# Patient Record
Sex: Female | Born: 1945
Health system: Southern US, Community
[De-identification: ages and names within clinical notes are randomized; demographics above are authoritative.]

## PROBLEM LIST (undated history)

## (undated) DIAGNOSIS — M1990 Unspecified osteoarthritis, unspecified site: Secondary | ICD-10-CM

## (undated) DIAGNOSIS — K219 Gastro-esophageal reflux disease without esophagitis: Secondary | ICD-10-CM

## (undated) DIAGNOSIS — K8020 Calculus of gallbladder without cholecystitis without obstruction: Secondary | ICD-10-CM

## (undated) DIAGNOSIS — Z5189 Encounter for other specified aftercare: Secondary | ICD-10-CM

## (undated) DIAGNOSIS — D649 Anemia, unspecified: Secondary | ICD-10-CM

## (undated) DIAGNOSIS — E785 Hyperlipidemia, unspecified: Secondary | ICD-10-CM

## (undated) DIAGNOSIS — I10 Essential (primary) hypertension: Secondary | ICD-10-CM

## (undated) DIAGNOSIS — H9191 Unspecified hearing loss, right ear: Secondary | ICD-10-CM

## (undated) DIAGNOSIS — IMO0001 Reserved for inherently not codable concepts without codable children: Secondary | ICD-10-CM

## (undated) DIAGNOSIS — F419 Anxiety disorder, unspecified: Secondary | ICD-10-CM

## (undated) HISTORY — PX: TONSILLECTOMY: SUR1361

## (undated) HISTORY — PX: OVARY SURGERY: SHX727

## (undated) HISTORY — DX: Hyperlipidemia, unspecified: E78.5

## (undated) HISTORY — PX: CHOLECYSTECTOMY: SHX55

## (undated) HISTORY — PX: APPENDECTOMY: SHX54

## (undated) HISTORY — PX: BACK SURGERY: SHX140

## (undated) HISTORY — DX: Anxiety disorder, unspecified: F41.9

## (undated) HISTORY — PX: BREAST EXCISIONAL BIOPSY: SUR124

---

## 1988-05-26 HISTORY — PX: BREAST CYST ASPIRATION: SHX578

## 2001-01-11 ENCOUNTER — Encounter: Payer: Self-pay | Admitting: Neurosurgery

## 2001-01-13 ENCOUNTER — Encounter: Payer: Self-pay | Admitting: Neurosurgery

## 2001-01-14 ENCOUNTER — Inpatient Hospital Stay (HOSPITAL_COMMUNITY): Admission: RE | Admit: 2001-01-14 | Discharge: 2001-01-15 | Payer: Self-pay | Admitting: Neurosurgery

## 2003-11-09 ENCOUNTER — Inpatient Hospital Stay (HOSPITAL_COMMUNITY): Admission: RE | Admit: 2003-11-09 | Discharge: 2003-11-10 | Payer: Self-pay | Admitting: Neurosurgery

## 2003-12-19 ENCOUNTER — Encounter: Admission: RE | Admit: 2003-12-19 | Discharge: 2003-12-19 | Payer: Self-pay | Admitting: Neurosurgery

## 2004-01-31 ENCOUNTER — Encounter: Admission: RE | Admit: 2004-01-31 | Discharge: 2004-01-31 | Payer: Self-pay | Admitting: Neurosurgery

## 2004-09-09 ENCOUNTER — Ambulatory Visit: Payer: Self-pay | Admitting: Family Medicine

## 2006-01-14 ENCOUNTER — Ambulatory Visit: Payer: Self-pay | Admitting: Family Medicine

## 2007-02-10 ENCOUNTER — Ambulatory Visit: Payer: Self-pay | Admitting: Family Medicine

## 2007-07-09 ENCOUNTER — Ambulatory Visit: Payer: Self-pay | Admitting: Gastroenterology

## 2008-02-14 ENCOUNTER — Ambulatory Visit: Payer: Self-pay | Admitting: Family Medicine

## 2009-02-19 ENCOUNTER — Ambulatory Visit: Payer: Self-pay | Admitting: Family Medicine

## 2010-02-28 ENCOUNTER — Ambulatory Visit: Payer: Self-pay | Admitting: Family Medicine

## 2011-03-03 ENCOUNTER — Ambulatory Visit: Payer: Self-pay | Admitting: Internal Medicine

## 2011-05-05 ENCOUNTER — Other Ambulatory Visit: Payer: Self-pay | Admitting: Neurosurgery

## 2011-05-05 DIAGNOSIS — M545 Low back pain: Secondary | ICD-10-CM

## 2011-05-06 ENCOUNTER — Ambulatory Visit
Admission: RE | Admit: 2011-05-06 | Discharge: 2011-05-06 | Disposition: A | Discharge status: Home / Self Care | Payer: BC Managed Care – PPO | Source: Ambulatory Visit | Attending: Neurosurgery | Admitting: Neurosurgery

## 2011-05-06 DIAGNOSIS — M545 Low back pain: Secondary | ICD-10-CM

## 2011-05-08 ENCOUNTER — Other Ambulatory Visit: Payer: Self-pay

## 2011-05-15 ENCOUNTER — Other Ambulatory Visit: Payer: Self-pay | Admitting: Neurosurgery

## 2011-05-15 DIAGNOSIS — M79605 Pain in left leg: Secondary | ICD-10-CM

## 2011-05-15 DIAGNOSIS — M545 Low back pain: Secondary | ICD-10-CM

## 2011-05-16 ENCOUNTER — Ambulatory Visit
Admission: RE | Admit: 2011-05-16 | Discharge: 2011-05-16 | Disposition: A | Discharge status: Home / Self Care | Payer: BC Managed Care – PPO | Source: Ambulatory Visit | Attending: Neurosurgery | Admitting: Neurosurgery

## 2011-05-16 DIAGNOSIS — M79605 Pain in left leg: Secondary | ICD-10-CM

## 2011-05-16 DIAGNOSIS — M545 Low back pain: Secondary | ICD-10-CM

## 2011-05-16 MED ORDER — IOHEXOL 180 MG/ML  SOLN
1.0000 mL | Freq: Once | INTRAMUSCULAR | Status: AC | PRN
  Administered 2011-05-16: 1 mL via EPIDURAL

## 2011-05-16 MED ORDER — METHYLPREDNISOLONE ACETATE 40 MG/ML INJ SUSP (RADIOLOG
120.0000 mg | Freq: Once | INTRAMUSCULAR | Status: AC
  Administered 2011-05-16: 120 mg via EPIDURAL

## 2011-05-23 ENCOUNTER — Encounter: Payer: Self-pay | Admitting: Neurosurgery

## 2011-05-27 ENCOUNTER — Encounter: Payer: Self-pay | Admitting: Neurosurgery

## 2011-06-20 ENCOUNTER — Encounter (HOSPITAL_COMMUNITY): Payer: Self-pay | Admitting: Pharmacy Technician

## 2011-06-23 ENCOUNTER — Other Ambulatory Visit (HOSPITAL_COMMUNITY): Payer: Self-pay | Admitting: *Deleted

## 2011-06-23 ENCOUNTER — Encounter (HOSPITAL_COMMUNITY): Payer: Self-pay

## 2011-06-24 ENCOUNTER — Encounter (HOSPITAL_COMMUNITY): Payer: Self-pay

## 2011-06-24 ENCOUNTER — Encounter (HOSPITAL_COMMUNITY): Payer: Self-pay | Admitting: Anesthesiology

## 2011-06-24 ENCOUNTER — Inpatient Hospital Stay (HOSPITAL_COMMUNITY)
Admission: RE | Admit: 2011-06-24 | Discharge: 2011-06-25 | DRG: 756 | Disposition: A | Discharge status: Home / Self Care | Payer: BC Managed Care – PPO | Source: Ambulatory Visit | Attending: Neurosurgery | Admitting: Neurosurgery

## 2011-06-24 ENCOUNTER — Inpatient Hospital Stay (HOSPITAL_COMMUNITY): Payer: BC Managed Care – PPO

## 2011-06-24 ENCOUNTER — Ambulatory Visit (HOSPITAL_COMMUNITY): Payer: BC Managed Care – PPO

## 2011-06-24 ENCOUNTER — Other Ambulatory Visit: Payer: Self-pay

## 2011-06-24 ENCOUNTER — Encounter (HOSPITAL_COMMUNITY)
Admission: RE | Disposition: A | Discharge status: Home / Self Care | Payer: Self-pay | Source: Ambulatory Visit | Attending: Neurosurgery

## 2011-06-24 ENCOUNTER — Ambulatory Visit (HOSPITAL_COMMUNITY): Payer: BC Managed Care – PPO | Admitting: Anesthesiology

## 2011-06-24 DIAGNOSIS — Z01818 Encounter for other preprocedural examination: Secondary | ICD-10-CM

## 2011-06-24 DIAGNOSIS — Z01812 Encounter for preprocedural laboratory examination: Secondary | ICD-10-CM

## 2011-06-24 DIAGNOSIS — M48061 Spinal stenosis, lumbar region without neurogenic claudication: Secondary | ICD-10-CM

## 2011-06-24 HISTORY — DX: Encounter for other specified aftercare: Z51.89

## 2011-06-24 HISTORY — DX: Gastro-esophageal reflux disease without esophagitis: K21.9

## 2011-06-24 HISTORY — DX: Essential (primary) hypertension: I10

## 2011-06-24 HISTORY — DX: Anemia, unspecified: D64.9

## 2011-06-24 HISTORY — DX: Calculus of gallbladder without cholecystitis without obstruction: K80.20

## 2011-06-24 HISTORY — PX: ANTERIOR LAT LUMBAR FUSION: SHX1168

## 2011-06-24 HISTORY — DX: Reserved for inherently not codable concepts without codable children: IMO0001

## 2011-06-24 LAB — BASIC METABOLIC PANEL
BUN: 27 mg/dL — ABNORMAL HIGH (ref 6–23)
CO2: 29 mEq/L (ref 19–32)
Chloride: 101 mEq/L (ref 96–112)
Creatinine, Ser: 0.86 mg/dL (ref 0.50–1.10)
GFR calc Af Amer: 82 mL/min — ABNORMAL LOW (ref >90)
Glucose, Bld: 97 mg/dL (ref 70–99)
Potassium: 4.3 mEq/L (ref 3.5–5.1)

## 2011-06-24 LAB — CBC
HCT: 40.4 % (ref 36.0–46.0)
Hemoglobin: 13.1 g/dL (ref 12.0–15.0)
MCV: 86.7 fL (ref 78.0–100.0)
RDW: 13.5 % (ref 11.5–15.5)
WBC: 6.3 10*3/uL (ref 4.0–10.5)

## 2011-06-24 SURGERY — ANTERIOR LATERAL LUMBAR FUSION 1 LEVEL
Anesthesia: General | Laterality: Right | Wound class: Clean

## 2011-06-24 MED ORDER — BUPIVACAINE HCL 0.5 % IJ SOLN
INTRAMUSCULAR | Status: DC | PRN
  Administered 2011-06-24: 20 mL

## 2011-06-24 MED ORDER — METHOCARBAMOL 500 MG PO TABS
500.0000 mg | ORAL_TABLET | Freq: Four times a day (QID) | ORAL | Status: DC | PRN
  Administered 2011-06-24 – 2011-06-25 (×2): 500 mg via ORAL
  Filled 2011-06-24 (×3): qty 1

## 2011-06-24 MED ORDER — ONDANSETRON HCL 4 MG/2ML IJ SOLN
INTRAMUSCULAR | Status: DC | PRN
  Administered 2011-06-24: 4 mg via INTRAVENOUS

## 2011-06-24 MED ORDER — BUPIVACAINE HCL 0.5 % IJ SOLN
INTRAMUSCULAR | Status: DC | PRN
  Administered 2011-06-24: 16 mL

## 2011-06-24 MED ORDER — SODIUM CHLORIDE 0.9 % IV SOLN
INTRAVENOUS | Status: AC
  Filled 2011-06-24: qty 500

## 2011-06-24 MED ORDER — PROPOFOL 10 MG/ML IV EMUL
INTRAVENOUS | Status: DC | PRN
  Administered 2011-06-24: 150 mg via INTRAVENOUS

## 2011-06-24 MED ORDER — SUCCINYLCHOLINE CHLORIDE 20 MG/ML IJ SOLN
INTRAMUSCULAR | Status: DC | PRN
  Administered 2011-06-24: 100 mg via INTRAVENOUS

## 2011-06-24 MED ORDER — LACTATED RINGERS IV SOLN
INTRAVENOUS | Status: DC | PRN
  Administered 2011-06-24 (×2): via INTRAVENOUS

## 2011-06-24 MED ORDER — TOBRAMYCIN SULFATE 80 MG/2ML IJ SOLN
80.0000 mg | Freq: Once | INTRAVENOUS | Status: AC
  Administered 2011-06-24: 80 mg via INTRAVENOUS
  Filled 2011-06-24: qty 2

## 2011-06-24 MED ORDER — 0.9 % SODIUM CHLORIDE (POUR BTL) OPTIME
TOPICAL | Status: DC | PRN
  Administered 2011-06-24: 1000 mL

## 2011-06-24 MED ORDER — FENTANYL CITRATE 0.05 MG/ML IJ SOLN
INTRAMUSCULAR | Status: DC | PRN
  Administered 2011-06-24 (×2): 50 ug via INTRAVENOUS
  Administered 2011-06-24: 150 ug via INTRAVENOUS
  Administered 2011-06-24: 100 ug via INTRAVENOUS

## 2011-06-24 MED ORDER — ZOLPIDEM TARTRATE 5 MG PO TABS: 10.0000 mg | ORAL_TABLET | Freq: Every evening | ORAL | Status: DC | PRN

## 2011-06-24 MED ORDER — LACTATED RINGERS IV SOLN
INTRAVENOUS | Status: DC | PRN
  Administered 2011-06-24: 08:00:00 via INTRAVENOUS

## 2011-06-24 MED ORDER — BUSPIRONE HCL 5 MG PO TABS
5.0000 mg | ORAL_TABLET | Freq: Three times a day (TID) | ORAL | Status: DC | PRN
  Filled 2011-06-24: qty 1

## 2011-06-24 MED ORDER — ACETAMINOPHEN 325 MG PO TABS: 650.0000 mg | ORAL_TABLET | ORAL | Status: DC | PRN

## 2011-06-24 MED ORDER — SODIUM CHLORIDE 0.9 % IR SOLN
Status: DC | PRN
  Administered 2011-06-24: 09:00:00

## 2011-06-24 MED ORDER — THROMBIN 5000 UNITS EX SOLR
CUTANEOUS | Status: DC | PRN
  Administered 2011-06-24: 5000 [IU] via TOPICAL

## 2011-06-24 MED ORDER — MIDAZOLAM HCL 5 MG/5ML IJ SOLN
INTRAMUSCULAR | Status: DC | PRN
  Administered 2011-06-24: 2 mg via INTRAVENOUS

## 2011-06-24 MED ORDER — SODIUM CHLORIDE 0.9 % IJ SOLN: 3.0000 mL | Freq: Two times a day (BID) | INTRAMUSCULAR | Status: DC

## 2011-06-24 MED ORDER — HYDROMORPHONE HCL PF 1 MG/ML IJ SOLN
1.0000 mg | INTRAMUSCULAR | Status: DC | PRN
  Administered 2011-06-24 (×2): 1 mg via INTRAMUSCULAR
  Filled 2011-06-24 (×2): qty 1

## 2011-06-24 MED ORDER — METHOCARBAMOL 100 MG/ML IJ SOLN
500.0000 mg | Freq: Four times a day (QID) | INTRAVENOUS | Status: DC | PRN
  Administered 2011-06-24: 500 mg via INTRAVENOUS
  Filled 2011-06-24: qty 5

## 2011-06-24 MED ORDER — PHENOL 1.4 % MT LIQD: 1.0000 | OROMUCOSAL | Status: DC | PRN

## 2011-06-24 MED ORDER — MENTHOL 3 MG MT LOZG: 1.0000 | LOZENGE | OROMUCOSAL | Status: DC | PRN

## 2011-06-24 MED ORDER — ONDANSETRON HCL 4 MG/2ML IJ SOLN: 4.0000 mg | Freq: Once | INTRAMUSCULAR | Status: DC | PRN

## 2011-06-24 MED ORDER — BACITRACIN 50000 UNITS IM SOLR
INTRAMUSCULAR | Status: AC
  Filled 2011-06-24: qty 1

## 2011-06-24 MED ORDER — SODIUM CHLORIDE 0.9 % IJ SOLN: 3.0000 mL | INTRAMUSCULAR | Status: DC | PRN

## 2011-06-24 MED ORDER — LISINOPRIL 5 MG PO TABS
5.0000 mg | ORAL_TABLET | Freq: Every day | ORAL | Status: DC
  Filled 2011-06-24 (×2): qty 1

## 2011-06-24 MED ORDER — ACETAMINOPHEN 650 MG RE SUPP: 650.0000 mg | RECTAL | Status: DC | PRN

## 2011-06-24 MED ORDER — ALENDRONATE SODIUM 70 MG PO TABS: 70.0000 mg | ORAL_TABLET | ORAL | Status: DC

## 2011-06-24 MED ORDER — HYDROMORPHONE HCL PF 1 MG/ML IJ SOLN
0.5000 mg | INTRAMUSCULAR | Status: DC | PRN
  Administered 2011-06-24: 0.5 mg via INTRAVENOUS

## 2011-06-24 MED ORDER — MUPIROCIN 2 % EX OINT
TOPICAL_OINTMENT | CUTANEOUS | Status: AC
  Filled 2011-06-24: qty 22

## 2011-06-24 MED ORDER — HYDROCHLOROTHIAZIDE 25 MG PO TABS
25.0000 mg | ORAL_TABLET | Freq: Every day | ORAL | Status: DC
Start: 2011-06-24 — End: 2011-06-25
  Filled 2011-06-24 (×2): qty 1

## 2011-06-24 MED ORDER — MORPHINE SULFATE 2 MG/ML IJ SOLN: 0.0500 mg/kg | INTRAMUSCULAR | Status: DC | PRN

## 2011-06-24 MED ORDER — HEMOSTATIC AGENTS (NO CHARGE) OPTIME
TOPICAL | Status: DC | PRN
  Administered 2011-06-24: 1 via TOPICAL

## 2011-06-24 MED ORDER — DEXAMETHASONE SODIUM PHOSPHATE 10 MG/ML IJ SOLN
10.0000 mg | Freq: Once | INTRAMUSCULAR | Status: AC
  Administered 2011-06-24: 10 mg via INTRAVENOUS
  Filled 2011-06-24: qty 1

## 2011-06-24 MED ORDER — DIPHENHYDRAMINE HCL 50 MG/ML IJ SOLN
50.0000 mg | Freq: Once | INTRAMUSCULAR | Status: DC
  Filled 2011-06-24: qty 1

## 2011-06-24 MED ORDER — ONDANSETRON HCL 4 MG/2ML IJ SOLN: 4.0000 mg | INTRAMUSCULAR | Status: DC | PRN

## 2011-06-24 MED ORDER — KCL IN DEXTROSE-NACL 20-5-0.45 MEQ/L-%-% IV SOLN
80.0000 mL/h | INTRAVENOUS | Status: DC
  Filled 2011-06-24 (×3): qty 1000

## 2011-06-24 MED ORDER — SIMVASTATIN 5 MG PO TABS
5.0000 mg | ORAL_TABLET | Freq: Every day | ORAL | Status: DC
  Administered 2011-06-24: 5 mg via ORAL
  Filled 2011-06-24 (×2): qty 1

## 2011-06-24 MED ORDER — MEPERIDINE HCL 25 MG/ML IJ SOLN: 6.2500 mg | INTRAMUSCULAR | Status: DC | PRN

## 2011-06-24 MED ORDER — HYDROCODONE-ACETAMINOPHEN 5-325 MG PO TABS
1.0000 | ORAL_TABLET | ORAL | Status: DC | PRN
  Administered 2011-06-24 – 2011-06-25 (×4): 2 via ORAL
  Filled 2011-06-24 (×4): qty 2

## 2011-06-24 MED ORDER — LIDOCAINE HCL 4 % MT SOLN
OROMUCOSAL | Status: DC | PRN
  Administered 2011-06-24: 4 mL via TOPICAL

## 2011-06-24 MED ORDER — HYDROMORPHONE HCL PF 1 MG/ML IJ SOLN
INTRAMUSCULAR | Status: AC
  Filled 2011-06-24: qty 1

## 2011-06-24 MED ORDER — BACITRACIN ZINC 500 UNIT/GM EX OINT
TOPICAL_OINTMENT | CUTANEOUS | Status: DC | PRN
  Administered 2011-06-24: 1 via TOPICAL

## 2011-06-24 MED ORDER — HYDROMORPHONE HCL PF 1 MG/ML IJ SOLN
0.2500 mg | INTRAMUSCULAR | Status: DC | PRN
  Administered 2011-06-24 (×5): 0.5 mg via INTRAVENOUS

## 2011-06-24 MED ORDER — ARTIFICIAL TEARS OP OINT
TOPICAL_OINTMENT | OPHTHALMIC | Status: DC | PRN
  Administered 2011-06-24: 1 via OPHTHALMIC

## 2011-06-24 MED ORDER — VANCOMYCIN HCL 500 MG IV SOLR
500.0000 mg | Freq: Once | INTRAVENOUS | Status: AC
  Administered 2011-06-24: 500 mg via INTRAVENOUS
  Filled 2011-06-24: qty 500

## 2011-06-24 MED ORDER — PROPOFOL 10 MG/ML IV EMUL
INTRAVENOUS | Status: DC | PRN
  Administered 2011-06-24: 75 ug/kg/min via INTRAVENOUS

## 2011-06-24 SURGICAL SUPPLY — 73 items
ADH SKN CLS APL DERMABOND .7 (GAUZE/BANDAGES/DRESSINGS) ×2
APL SKNCLS STERI-STRIP NONHPOA (GAUZE/BANDAGES/DRESSINGS) ×2
BAG DECANTER FOR FLEXI CONT (MISCELLANEOUS) ×3 IMPLANT
BENZOIN TINCTURE PRP APPL 2/3 (GAUZE/BANDAGES/DRESSINGS) ×3 IMPLANT
BLADE SURG ROTATE 9660 (MISCELLANEOUS) IMPLANT
BONE MATRIX OSTEOCEL PLUS 1CC (Bone Implant) ×2 IMPLANT
BRUSH SCRUB EZ PLAIN DRY (MISCELLANEOUS) ×3 IMPLANT
CLOTH BEACON ORANGE TIMEOUT ST (SAFETY) ×3 IMPLANT
CONT SPEC 4OZ CLIKSEAL STRL BL (MISCELLANEOUS) ×1 IMPLANT
COVER BACK TABLE 24X17X13 BIG (DRAPES) ×1 IMPLANT
COVER TABLE BACK 60X90 (DRAPES) ×2 IMPLANT
DERMABOND ADVANCED (GAUZE/BANDAGES/DRESSINGS) ×1
DERMABOND ADVANCED .7 DNX12 (GAUZE/BANDAGES/DRESSINGS) ×2 IMPLANT
DILATOR NON-RADIOLUCENT CANN (MISCELLANEOUS) ×1 IMPLANT
DRAPE C-ARM 42X72 X-RAY (DRAPES) ×3 IMPLANT
DRAPE C-ARMOR (DRAPES) ×3 IMPLANT
DRAPE LAPAROTOMY 100X72X124 (DRAPES) ×3 IMPLANT
DRAPE SURG 17X23 STRL (DRAPES) ×6 IMPLANT
DRESSING TELFA 8X3 (GAUZE/BANDAGES/DRESSINGS) ×4 IMPLANT
DURAPREP 26ML APPLICATOR (WOUND CARE) ×3 IMPLANT
ELECT BLADE 4.0 EZ CLEAN MEGAD (MISCELLANEOUS) ×3
ELECT REM PT RETURN 9FT ADLT (ELECTROSURGICAL) ×3
ELECTRODE BLDE 4.0 EZ CLN MEGD (MISCELLANEOUS) ×2 IMPLANT
ELECTRODE REM PT RTRN 9FT ADLT (ELECTROSURGICAL) ×2 IMPLANT
EVACUATOR 1/8 PVC DRAIN (DRAIN) IMPLANT
GAUZE SPONGE 4X4 16PLY XRAY LF (GAUZE/BANDAGES/DRESSINGS) ×1 IMPLANT
GLOVE BIO SURGEON STRL SZ8 (GLOVE) IMPLANT
GLOVE BIOGEL PI IND STRL 7.0 (GLOVE) IMPLANT
GLOVE BIOGEL PI INDICATOR 7.0 (GLOVE) ×1
GLOVE ECLIPSE 7.5 STRL STRAW (GLOVE) ×3 IMPLANT
GLOVE EXAM NITRILE LRG STRL (GLOVE) IMPLANT
GLOVE EXAM NITRILE MD LF STRL (GLOVE) ×2 IMPLANT
GLOVE EXAM NITRILE XL STR (GLOVE) IMPLANT
GLOVE EXAM NITRILE XS STR PU (GLOVE) IMPLANT
GLOVE SURG SS PI 6.5 STRL IVOR (GLOVE) ×2 IMPLANT
GOWN BRE IMP SLV AUR LG STRL (GOWN DISPOSABLE) ×3 IMPLANT
GOWN BRE IMP SLV AUR XL STRL (GOWN DISPOSABLE) ×1 IMPLANT
GOWN STRL REIN 2XL LVL4 (GOWN DISPOSABLE) ×3 IMPLANT
IMPL COROENT XL 8X8X45 (Intraocular Lens) IMPLANT
IMPLANT COROENT XL 8X8X45 (Intraocular Lens) ×3 IMPLANT
K-WIRE NITHNOL TROCAR TIP (WIRE) ×2 IMPLANT
KIT BASIN OR (CUSTOM PROCEDURE TRAY) ×3 IMPLANT
KIT DILATOR XLIF 5 (KITS) IMPLANT
KIT MAXCESS (KITS) ×1 IMPLANT
KIT NDL NVM5 EMG ELECT (KITS) IMPLANT
KIT NEEDLE NVM5 EMG ELECT (KITS) ×2 IMPLANT
KIT NEEDLE NVM5 EMG ELECTRODE (KITS) ×1
KIT ROOM TURNOVER OR (KITS) ×3 IMPLANT
KIT XLIF (KITS) ×1
NEEDLE HYPO 22GX1.5 SAFETY (NEEDLE) ×3 IMPLANT
NEEDLE TARGETING (NEEDLE) ×2 IMPLANT
NS IRRIG 1000ML POUR BTL (IV SOLUTION) ×3 IMPLANT
PACK LAMINECTOMY NEURO (CUSTOM PROCEDURE TRAY) ×3 IMPLANT
PUTTY BONE GRAFT KIT 2.5ML (Bone Implant) ×1 IMPLANT
ROD PREBENT 45MM (Rod) ×1 IMPLANT
SCREW POLYAXIA MIS 6.5X40MM (Screw) ×2 IMPLANT
SPONGE GAUZE 4X4 12PLY (GAUZE/BANDAGES/DRESSINGS) ×3 IMPLANT
SPONGE LAP 4X18 X RAY DECT (DISPOSABLE) IMPLANT
SPONGE SURGIFOAM ABS GEL SZ50 (HEMOSTASIS) ×1 IMPLANT
STAPLER SKIN PROX WIDE 3.9 (STAPLE) ×3 IMPLANT
SUT VIC AB 2-0 OS6 18 (SUTURE) ×19 IMPLANT
SUT VIC AB 3-0 CP2 18 (SUTURE) ×9 IMPLANT
SYR 20ML ECCENTRIC (SYRINGE) ×3 IMPLANT
TAPE CLOTH 3X10 TAN LF (GAUZE/BANDAGES/DRESSINGS) ×6 IMPLANT
TAPE CLOTH SURG 4X10 WHT LF (GAUZE/BANDAGES/DRESSINGS) ×1 IMPLANT
TISSUE DILATOR C, RADIOLUCENT ×1 IMPLANT
TOP CLSR SEQUOIA (Orthopedic Implant) ×2 IMPLANT
TOWEL OR 17X24 6PK STRL BLUE (TOWEL DISPOSABLE) ×3 IMPLANT
TOWEL OR 17X26 10 PK STRL BLUE (TOWEL DISPOSABLE) ×3 IMPLANT
TRAP SPECIMEN MUCOUS 40CC (MISCELLANEOUS) IMPLANT
TRAY FOLEY BAG SILVER LF 14FR (CATHETERS) ×1 IMPLANT
TRAY FOLEY CATH 14FRSI W/METER (CATHETERS) ×2 IMPLANT
WATER STERILE IRR 1000ML POUR (IV SOLUTION) ×3 IMPLANT

## 2011-06-24 NOTE — OR Nursing (Signed)
Neuro Monitoring provided by [employer] 

## 2011-06-24 NOTE — Addendum Note (Signed)
Addendum  created 06/24/11 1134 by Kerby Nora, MD   Modules edited:Orders

## 2011-06-24 NOTE — H&P (Signed)
Brittany Alvarez is an 66 y.o. female.   Chief Complaint: Back and leg pain HPI: The patient is 66 year old female who had a fusion at L4-5 in the past. She did well at that time but now has a recent history of back and leg pain. She was treated with conservative therapy without improvement. Imaging studies of the lumbar spine showed adjacent level disease at L3-4 with subsequent stenosis. After discussing the options and due to the lack of improvement with nonsurgical for the patient requested surgical intervention and now comes in for her procedure.  Past Medical History  Diagnosis Date  . Hypertension     sees Dr. Judithann Graves in [place], [place] [phone]  . Anemia   . Blood transfusion     approx. 40 years ago  . Gallstones     hx of  . GERD (gastroesophageal reflux disease)     Past Surgical History  Procedure Date  . Cholecystectomy   . Appendectomy   . Ovary surgery     removal of left and left tube  . Back surgery     x 2  . Tonsillectomy   . Breast biopsy     Family History  Problem Relation Age of Onset  . Anesthesia problems Neg Hx   . Hypotension Neg Hx   . Malignant hyperthermia Neg Hx   . Pseudochol deficiency Neg Hx    Social History:  reports that she has never smoked. She does not have any smokeless tobacco history on file. She reports that she drinks alcohol. She reports that she does not use illicit drugs.  Allergies:  Allergies  Allergen Reactions  . Latex Hives    Patient states painful and rash    Medications Prior to Admission  Medication Dose Route Frequency Provider Last Rate Last Dose  . bacitracin 09811 UNITS injection           . dexamethasone (DECADRON) injection 10 mg  10 mg Intravenous Once Reinaldo Meeker, MD      . diphenhydrAMINE (BENADRYL) injection 50 mg  50 mg Intravenous Once Reinaldo Meeker, MD      . mupirocin ointment (BACTROBAN) 2 %           . sodium chloride 0.9 % infusion           . tobramycin (NEBCIN) 80 mg in dextrose 5 % 50  mL IVPB  80 mg Intravenous Once Reinaldo Meeker, MD      . vancomycin (VANCOCIN) 500 mg in sodium chloride 0.9 % 100 mL IVPB  500 mg Intravenous Once Reinaldo Meeker, MD       No current outpatient prescriptions on file as of 06/24/2011.    Results for orders placed during the hospital encounter of 06/24/11 (from the past 48 hour(s))  CBC     Status: Normal   Collection Time   06/24/11  6:43 AM      Component Value Range Comment   WBC 6.3  4.0 - 10.5 (K/uL)    RBC 4.66  3.87 - 5.11 (MIL/uL)    Hemoglobin 13.1  12.0 - 15.0 (g/dL)    HCT 91.4  78.2 - 95.6 (%)    MCV 86.7  78.0 - 100.0 (fL)    MCH 28.1  26.0 - 34.0 (pg)    MCHC 32.4  30.0 - 36.0 (g/dL)    RDW 21.3  08.6 - 57.8 (%)    Platelets 309  150 - 400 (K/uL)    No  results found.  Pertinent items are noted in HPI.  Blood pressure 134/79, pulse 69, temperature 97.7 F (36.5 C), temperature source Oral, resp. rate 18, height 5' (1.524 m), weight 63.504 kg (140 lb), SpO2 98.00%.  The patient is awake alert and oriented with no facial asymmetry her gait is non-antalgic. Reflexes are decreased diffusely percent and sensation are intact. Assessment/Plan Impression is that of adjacent level disease at L3-4 after a fusion in the past at L4-5. The plan is for an anterolateral fusion at L3-4 with percutaneous pedicle screw fixation.  Reinaldo Meeker, MD 06/24/2011, 7:31 AM

## 2011-06-24 NOTE — Op Note (Signed)
Preop diagnosis: Adjacent level disease L3-4 with stenosis Postoperative diagnosis: Same Procedure: L3-4 anterolateral fusion via a right retroperitoneal approach with peek interbody spacer followed by right L3-4 percutaneous pedicle screw fixation with Pathfinder pedicle screw system Surgeon: Insurance risk surveyor: Phoebe Perch  After and placed in the lateral decubitus position with the right side out was aligned appropriately with fluoroscopy. There was then prepped and draped in the usual sterile fashion. Linear incision was made in the right flank above the L3-4 disc space. Was carried down to the fascia. A second incision was made more posteriorly to allow easier access into the retroperitoneum. This was done without difficulty. It was then turned to and access into the retroperitoneum from the more anterior incision was allowed. Initial dilator was passed down to the L3-4 disc in good position and tested with electrical stimulation and no abnormal readings were encountered. The dilator was then docked to the disc space and sequential dilation through the says muscle was carried out. The retractor was then placed and secured to the table in standard fashion. We then did testing and saw no abnormal readings. We docked at the retractor in good position and opened it in standard fashion. We then tested once more with no abnormal readings encountered. We then incised the disc a 15 blade and thoroughly cleaned out with a variety of instruments. We released the annulus on the opposite side with a Cobb elevator. We then did sequential distraction of the disc space up to an 8 mm size and felt that this was an excellent fit. We therefore chose an 8 x 18 x 45 mm graft and filled it with a mixture of morselized allograft. We then irrigated copiously. Fluoroscopy showed the graft to be in excellent position. We then removed the retractor and closed the wound in layers of Vicryl. Dermabond was placed on the skin. We then  placed percutaneous pedicle screws at L3-4. We press Jamshidi needle pedicles of L3 and L4 without difficulty. We then passed guidewire and removed the Jamshidi needles. We then connected the 2 small incisions. We incised the fascia between the 2 wires and then did sequential dilation in standard fashion. We used a small bone all to penetrate the cortical bone. We tapped with 55 mm tap and then placed 6 5 x 40 mm screws at both levels. These were followed into excellent position. We then chose a 45 mm rod passed it down the tatters without difficulty. We then secured the rod to the top of the screws at the top loading nut and the rod reduced nicely. We then did tightening and final tightening with torque and counter torque. We then removed the tatters without difficulty. Final fluoroscopy showed good position of the interbody spacer screws and rod. Irrigated once more close these wounds with interrupted Vicryl. The staples on the skin of this incision. Sterile dressing was then applied the patient was extubated and taken to recovery in stable condition.

## 2011-06-24 NOTE — Transfer of Care (Signed)
Immediate Anesthesia Transfer of Care Note  Patient: Brittany Alvarez  Procedure(s) Performed:  ANTERIOR LATERAL LUMBAR FUSION 1 LEVEL - Right Lumbar Three-Four Anterior Lateral Lumbar Interbody Fusion with Right Lumbar Three-Four percutaneous pedicle screws (Lateral Postion for both procedures); LUMBAR PERCUTANEOUS PEDICLE SCREW 1 LEVEL  Patient Location: PACU  Anesthesia Type: General  Level of Consciousness: awake, alert , oriented and patient cooperative  Airway & Oxygen Therapy: Patient Spontanous Breathing and Patient connected to face mask oxygen  Post-op Assessment: Report given to PACU RN, Post -op Vital signs reviewed and stable and Patient moving all extremities X 4  Post vital signs: Reviewed and stable  Complications: No apparent anesthesia complications

## 2011-06-24 NOTE — Progress Notes (Signed)
PHARMACIST - PHYSICIAN COMMUNICATION  CONCERNING: P&T Medication Policy Regarding Oral Bisphosphonates  RECOMMENDATION: Your order for alendronate (Fosamax), ibandronate (Boniva), or risedronate (Actonel) has been discontinued at this time.  If the patient's post-hospital medical condition warrants safe use of this class of drugs, please resume the pre-hospital regimen upon discharge.  DESCRIPTION:  Alendronate (Fosamax), ibandronate (Boniva), and risedronate (Actonel) can cause severe esophageal erosions in patients who are unable to remain upright at least 30 minutes after taking this medication.   Since brief interruptions in therapy are thought to have minimal impact on bone mineral density, the Pharmacy & Therapeutics Committee has established that bisphosphonate orders should be routinely discontinued during hospitalization.   To override this safety policy and permit administration of Boniva, Fosamax, or Actonel in the hospital, prescribers must write "DO NOT HOLD" in the comments section when placing the order for this class of medications.   Len Childs T 06/24/2011

## 2011-06-24 NOTE — Preoperative (Signed)
Beta Blockers   Reason not to administer Beta Blockers:Not Applicable 

## 2011-06-24 NOTE — Anesthesia Preprocedure Evaluation (Addendum)
Anesthesia Evaluation  Patient identified by MRN, date of birth, ID band Patient awake    Reviewed: Allergy & Precautions, H&P , NPO status , Patient's Chart, lab work & pertinent test results  History of Anesthesia Complications Negative for: history of anesthetic complications  Airway Mallampati: II TM Distance: >3 FB Neck ROM: Full    Dental  (+) Teeth Intact and Dental Advisory Given   Pulmonary neg pulmonary ROS,  clear to auscultation        Cardiovascular hypertension, Pt. on medications Regular Normal    Neuro/Psych  Neuromuscular disease (Pain, weakness, numbness left leg) Negative Psych ROS   GI/Hepatic Neg liver ROS, GERD-  Medicated and Controlled,  Endo/Other  Negative Endocrine ROS  Renal/GU negative Renal ROS  Genitourinary negative   Musculoskeletal  (+) Arthritis -, Osteoarthritis,    Abdominal (+) obese,   Peds  Hematology  (+) Blood dyscrasia, anemia ,   Anesthesia Other Findings   Reproductive/Obstetrics negative OB ROS                          Anesthesia Physical Anesthesia Plan  ASA: II  Anesthesia Plan: General   Post-op Pain Management:    Induction: Intravenous  Airway Management Planned: Oral ETT  Additional Equipment:   Intra-op Plan:   Post-operative Plan: Extubation in OR  Informed Consent: I have reviewed the patients History and Physical, chart, labs and discussed the procedure including the risks, benefits and alternatives for the proposed anesthesia with the patient or authorized representative who has indicated his/her understanding and acceptance.   Dental advisory given  Plan Discussed with: CRNA and Anesthesiologist  Anesthesia Plan Comments:         Anesthesia Quick Evaluation

## 2011-06-24 NOTE — Plan of Care (Signed)
Problem: Consults Goal: Diagnosis - Spinal Surgery Outcome: Completed/Met Date Met:  06/24/11 Thoraco/Lumbar Spine Fusion

## 2011-06-24 NOTE — Anesthesia Postprocedure Evaluation (Signed)
  Anesthesia Post-op Note  Patient: Brittany Alvarez  Procedure(s) Performed:  ANTERIOR LATERAL LUMBAR FUSION 1 LEVEL - Right Lumbar Three-Four Anterior Lateral Lumbar Interbody Fusion with Right Lumbar Three-Four percutaneous pedicle screws (Lateral Postion for both procedures); LUMBAR PERCUTANEOUS PEDICLE SCREW 1 LEVEL  Patient Location: PACU  Anesthesia Type: General  Level of Consciousness: awake, alert  and oriented  Airway and Oxygen Therapy: Patient Spontanous Breathing and Patient connected to nasal cannula oxygen  Post-op Pain: mild  Post-op Assessment: Post-op Vital signs reviewed, Patient's Cardiovascular Status Stable, Respiratory Function Stable, Patent Airway, No signs of Nausea or vomiting and Pain level controlled  Post-op Vital Signs: Reviewed and stable  Complications: No apparent anesthesia complications

## 2011-06-24 NOTE — Brief Op Note (Signed)
06/24/2011  10:17 AM  PATIENT:  Brittany Alvarez  66 y.o. female  PRE-OPERATIVE DIAGNOSIS:  Adjacent level disease w/stenosis  POST-OPERATIVE DIAGNOSIS:  Adjacent level disease w/stenosis  PROCEDURE:  Procedure(s): ANTERIOR LATERAL LUMBAR FUSION 1 LEVEL LUMBAR PERCUTANEOUS PEDICLE SCREW 1 LEVEL  SURGEON:  Surgeon(s): Reinaldo Meeker, MD Clydene Fake, MD  PHYSICIAN ASSISTANT:   ASSISTANTSPhoebe Perch   ANESTHESIA:   general  EBL:  Total I/O In: 1000 [I.V.:1000] Out: 450 [Urine:350; Blood:100]  BLOOD ADMINISTERED:none  DRAINS: none   LOCAL MEDICATIONS USED:  MARCAINE 30CC  SPECIMEN:  No Specimen  DISPOSITION OF SPECIMEN:  N/A  COUNTS:  YES  TOURNIQUET:  * No tourniquets in log *  DICTATION: .Dragon Dictation  PLAN OF CARE: Admit to inpatient   PATIENT DISPOSITION:  PACU - hemodynamically stable.   Delay start of Pharmacological VTE agent (>24hrs) due to surgical blood loss or risk of bleeding:  {YES/NO/NOT APPLICABLE:20182

## 2011-06-24 NOTE — Progress Notes (Signed)
ANTIBIOTIC CONSULT NOTE - INITIAL  Pharmacy Consult for Vancomycin Indication: Surgical prophylaxis x 1 dose 12 hours post op.  Allergies  Allergen Reactions  . Latex Hives    Patient states painful and rash    Patient Measurements: Height: 5' (152.4 cm) Weight: 140 lb (63.504 kg) IBW/kg (Calculated) : 45.5   Vital Signs: Temp: 97.3 F (36.3 C) (01/29 1225) Temp src: Oral (01/29 0637) BP: 179/79 mmHg (01/29 1225) Pulse Rate: 80  (01/29 1225) Intake/Output from previous day:   Intake/Output from this shift: Total I/O In: 2150 [I.V.:1900; Other:250] Out: 450 [Urine:350; Blood:100]  Labs:  [facility] 06/24/11 0643  WBC 6.3  HGB 13.1  PLT 309  LABCREA --  CREATININE 0.86   Estimated Creatinine Clearance: 55.7 ml/min (by C-G formula based on Cr of 0.86). No results found for this basename: VANCOTROUGH:2,VANCOPEAK:2,VANCORANDOM:2,GENTTROUGH:2,GENTPEAK:2,GENTRANDOM:2,TOBRATROUGH:2,TOBRAPEAK:2,TOBRARND:2,AMIKACINPEAK:2,AMIKACINTROU:2,AMIKACIN:2, in the last 72 hours   Microbiology: Recent Results (from the past 720 hour(s))  SURGICAL PCR SCREEN     Status: Normal   Collection Time   06/24/11  8:13 AM      Component Value Range Status Comment   MRSA, PCR NEGATIVE  NEGATIVE  Final    Staphylococcus aureus NEGATIVE  NEGATIVE  Final     Medical History: Past Medical History  Diagnosis Date  . Hypertension     sees Dr. Judithann Graves in [place], [place] [phone]  . Anemia   . Blood transfusion     approx. 40 years ago  . Gallstones     hx of  . GERD (gastroesophageal reflux disease)     Medications:  Prescriptions prior to admission  Medication Sig Dispense Refill  . alendronate (FOSAMAX) 70 MG tablet Take 70 mg by mouth every 7 (seven) days. Take with a full glass of water on an empty stomach. Taken on Sundays      . busPIRone (BUSPAR) 5 MG tablet Take 5 mg by mouth 3 (three) times daily as needed. For anxiety      . Calcium Carbonate-Vitamin D (CALCIUM + D) 600-200  MG-UNIT TABS Take 1 tablet by mouth daily.      . hydrochlorothiazide (HYDRODIURIL) 25 MG tablet Take 25 mg by mouth daily.      Marland Kitchen HYDROcodone-acetaminophen (NORCO) 5-325 MG per tablet Take 1 tablet by mouth every 4 (four) hours as needed. For pain      . lisinopril (PRINIVIL,ZESTRIL) 5 MG tablet Take 5 mg by mouth daily.      Marland Kitchen lovastatin (MEVACOR) 20 MG tablet Take 40 mg by mouth at bedtime.      . meloxicam (MOBIC) 15 MG tablet Take 15 mg by mouth daily.      . methocarbamol (ROBAXIN) 500 MG tablet Take 500 mg by mouth 2 (two) times daily as needed. For pain      . OVER THE COUNTER MEDICATION Take 1 tablet by mouth daily as needed. allergies       Scheduled:    . bacitracin      . dexamethasone  10 mg Intravenous Once  . hydrochlorothiazide  25 mg Oral Daily  . HYDROmorphone      . HYDROmorphone      . HYDROmorphone      . lisinopril  5 mg Oral Daily  . mupirocin ointment      . simvastatin  5 mg Oral q1800  . sodium chloride      . sodium chloride  3 mL Intravenous Q12H  . tobramycin  80 mg Intravenous Once  . vancomycin  500 mg Intravenous Once  . vancomycin  500 mg Intravenous Once  . DISCONTD: alendronate  70 mg Oral Q7 days  . DISCONTD: diphenhydrAMINE  50 mg Intravenous Once   Infusions:    . dextrose 5 % and 0.45 % NaCl with KCl 20 mEq/L     PRN: acetaminophen, acetaminophen, busPIRone, HYDROcodone-acetaminophen, HYDROmorphone (DILAUDID) injection, menthol-cetylpyridinium, methocarbamol(ROBAXIN) IV, methocarbamol, ondansetron (ZOFRAN) IV, phenol, sodium chloride, zolpidem, DISCONTD: 0.9 % irrigation (POUR BTL), DISCONTD: bacitracin irrigation, DISCONTD: bacitracin, DISCONTD: bupivacaine, DISCONTD: bupivacaine, DISCONTD: hemostatic agents, DISCONTD: HYDROmorphone DISCONTD:  HYDROmorphone (DILAUDID) injection, DISCONTD: meperidine, DISCONTD: morphine, DISCONTD: ondansetron (ZOFRAN) IV, DISCONTD: thrombin, DISCONTD: thrombin Assessment: 66 yo female s/p lumbar fusion.   Vancomycin preop 500 mg IV x1 given at 0755 today. No drain per RN's report, thus orders are for x1 dose 12hrs post-op.   Goal of Therapy: Post op surgical prophylaxis. Vancomycin Trough 10-15 mcg/ml   Plan:  Vancomycin 500 mg IV x 1 give at 19:30.   Arman Filter 06/24/2011,1:25 PM

## 2011-06-24 NOTE — Anesthesia Procedure Notes (Signed)
Procedure Name: Intubation Date/Time: 06/24/2011 8:02 AM Performed by: Leona Singleton A. Patient Re-evaluated:Patient Re-evaluated prior to inductionOxygen Delivery Method: Circle System Utilized Preoxygenation: Pre-oxygenation with 100% oxygen Intubation Type: IV induction Ventilation: Mask ventilation without difficulty Laryngoscope Size: Miller and 2 Grade View: Grade I Tube type: Oral Tube size: 7.0 mm Number of attempts: 1 Airway Equipment and Method: stylet Placement Confirmation: ETT inserted through vocal cords under direct vision,  positive ETCO2 and breath sounds checked- equal and bilateral Secured at: 21 cm Tube secured with: Tape Dental Injury: Teeth and Oropharynx as per pre-operative assessment

## 2011-06-25 ENCOUNTER — Encounter (HOSPITAL_COMMUNITY): Payer: Self-pay | Admitting: Neurosurgery

## 2011-06-25 MED ORDER — HYDROCODONE-ACETAMINOPHEN 5-325 MG PO TABS: 1.0000 | ORAL_TABLET | ORAL | Status: AC | PRN

## 2011-06-25 NOTE — Discharge Summary (Signed)
Physician Discharge Summary  Patient ID: Brittany Alvarez MRN: [id number] DOB/AGE: 1946-03-28 66 y.o.  Admit date: 06/24/2011 Discharge date: 06/25/2011  Admission Diagnoses:  Discharge Diagnoses:  Active Problems:  * No active hospital problems. *    Discharged Condition: good  Hospital Course: Surgery one day, home the next. Doing well. Much improved pain. Ambulated well.  Consults: None  Significant Diagnostic Studies: None  Treatments: surgery: L34 XLIF with pedicle screws  Discharge Exam: Blood pressure 134/52, pulse 89, temperature 98.1 F (36.7 C), temperature source Oral, resp. rate 18, height 5' (1.524 m), weight 63.504 kg (140 lb), SpO2 100.00%. Incision/Wound:Healing well  Disposition:   Discharge Orders    Future Appointments: Provider: Department: Dept Phone: Center:   06/27/2011 9:00 AM [facility]  None     Future Orders Please Complete By Expires   Diet general      Discharge instructions      Comments:   Mostly bedrest. Get up 9 or 10 times each day and walk for 15-20 minutes each time. Very little sitting the first week. No riding in the car until your first post op appointment. If you had neck surgery...may shower from the chest down. If you had low back surgery....you may shower with a saran wrap covering over the incision. Take your pain medicine as needed...and other medicines that you are instructed to take. Call for an appointment...[phone].   Call MD for:  temperature >100.4      Call MD for:  persistant nausea and vomiting      Call MD for:  severe uncontrolled pain      Call MD for:  redness, tenderness, or signs of infection (pain, swelling, redness, odor or green/yellow discharge around incision site)      Call MD for:  difficulty breathing, headache or visual disturbances      Call MD for:  hives        Medication List  As of 06/25/2011  8:18 AM   STOP taking these medications         meloxicam 15 MG tablet         TAKE these medications         alendronate 70 MG tablet   Commonly known as: FOSAMAX   Take 70 mg by mouth every 7 (seven) days. Take with a full glass of water on an empty stomach. Taken on Sundays      busPIRone 5 MG tablet   Commonly known as: BUSPAR   Take 5 mg by mouth 3 (three) times daily as needed. For anxiety      Calcium + D 600-200 MG-UNIT Tabs   Generic drug: Calcium Carbonate-Vitamin D   Take 1 tablet by mouth daily.      hydrochlorothiazide 25 MG tablet   Commonly known as: HYDRODIURIL   Take 25 mg by mouth daily.      HYDROcodone-acetaminophen 5-325 MG per tablet   Commonly known as: NORCO   Take 1 tablet by mouth every 4 (four) hours as needed. For pain      HYDROcodone-acetaminophen 5-325 MG per tablet   Commonly known as: NORCO   Take 1-2 tablets by mouth every 4 (four) hours as needed.      lisinopril 5 MG tablet   Commonly known as: PRINIVIL,ZESTRIL   Take 5 mg by mouth daily.      lovastatin 20 MG tablet   Commonly known as: MEVACOR   Take 40 mg by mouth at  bedtime.      methocarbamol 500 MG tablet   Commonly known as: ROBAXIN   Take 500 mg by mouth 2 (two) times daily as needed. For pain      OVER THE COUNTER MEDICATION   Take 1 tablet by mouth daily as needed. allergies             At home rest most of the time. Get up 9 or 10 times each day and take a 15 or 20 minute walk. No riding in the car and to your first postoperative appointment. If you have neck surgery you may shower from the chest down starting on the third postoperative day. If you had back surgery he may start showering on the third postoperative day with saran wrap wrapped around your incisional area 3 times. After the shower remove the saran wrap. Take pain medicine as needed and other medications as instructed. Call my office for an appointment.  SignedReinaldo Meeker, MD 06/25/2011, 8:18 AM

## 2011-06-27 ENCOUNTER — Other Ambulatory Visit (HOSPITAL_COMMUNITY): Payer: BC Managed Care – PPO

## 2012-03-03 ENCOUNTER — Ambulatory Visit: Payer: Self-pay | Admitting: Internal Medicine

## 2012-08-30 ENCOUNTER — Ambulatory Visit: Payer: Self-pay | Admitting: Internal Medicine

## 2013-03-04 ENCOUNTER — Ambulatory Visit: Payer: Self-pay | Admitting: Internal Medicine

## 2014-03-06 LAB — BASIC METABOLIC PANEL
BUN: 23 mg/dL — AB (ref 4–21)
CREATININE: 0.9 mg/dL (ref <=1.1)

## 2014-03-06 LAB — LIPID PANEL
CHOLESTEROL: 227 mg/dL — AB (ref 0–200)
HDL: 68 mg/dL (ref 35–70)
LDL CALC: 132 mg/dL
TRIGLYCERIDES: 134 mg/dL (ref 40–160)

## 2014-03-06 LAB — TSH: TSH: 2.1 u[IU]/mL (ref <=5.90)

## 2014-03-06 LAB — CBC AND DIFFERENTIAL: Hemoglobin: 12.9 g/dL (ref 12.0–16.0)

## 2014-03-07 ENCOUNTER — Ambulatory Visit: Payer: Self-pay | Admitting: Otolaryngology

## 2014-03-29 ENCOUNTER — Ambulatory Visit: Payer: Self-pay | Admitting: Internal Medicine

## 2014-05-26 HISTORY — PX: OTHER SURGICAL HISTORY: SHX169

## 2014-11-13 ENCOUNTER — Other Ambulatory Visit: Payer: Self-pay

## 2014-11-13 MED ORDER — BUSPIRONE HCL 5 MG PO TABS: 5.0000 mg | ORAL_TABLET | Freq: Three times a day (TID) | ORAL | Status: DC

## 2014-12-18 ENCOUNTER — Other Ambulatory Visit: Payer: Self-pay | Admitting: Internal Medicine

## 2014-12-18 MED ORDER — TIZANIDINE HCL 4 MG PO TABS: 4.0000 mg | ORAL_TABLET | Freq: Three times a day (TID) | ORAL | Status: DC | PRN

## 2015-01-22 ENCOUNTER — Other Ambulatory Visit: Payer: Self-pay | Admitting: Family Medicine

## 2015-02-19 ENCOUNTER — Other Ambulatory Visit: Payer: Self-pay

## 2015-02-19 MED ORDER — ALENDRONATE SODIUM 70 MG PO TABS: 70.0000 mg | ORAL_TABLET | ORAL | Status: DC

## 2015-02-19 MED ORDER — HYDROCHLOROTHIAZIDE 25 MG PO TABS: 25.0000 mg | ORAL_TABLET | Freq: Every day | ORAL | Status: DC

## 2015-02-21 ENCOUNTER — Other Ambulatory Visit: Payer: Self-pay | Admitting: Internal Medicine

## 2015-03-04 ENCOUNTER — Other Ambulatory Visit: Payer: Self-pay | Admitting: Internal Medicine

## 2015-03-04 ENCOUNTER — Encounter: Payer: Self-pay | Admitting: Internal Medicine

## 2015-03-04 DIAGNOSIS — M19049 Primary osteoarthritis, unspecified hand: Secondary | ICD-10-CM | POA: Insufficient documentation

## 2015-03-04 DIAGNOSIS — Z86018 Personal history of other benign neoplasm: Secondary | ICD-10-CM | POA: Insufficient documentation

## 2015-03-04 DIAGNOSIS — M810 Age-related osteoporosis without current pathological fracture: Secondary | ICD-10-CM | POA: Insufficient documentation

## 2015-03-04 DIAGNOSIS — I10 Essential (primary) hypertension: Secondary | ICD-10-CM | POA: Insufficient documentation

## 2015-03-04 DIAGNOSIS — F411 Generalized anxiety disorder: Secondary | ICD-10-CM | POA: Insufficient documentation

## 2015-03-04 DIAGNOSIS — T7840XA Allergy, unspecified, initial encounter: Secondary | ICD-10-CM | POA: Insufficient documentation

## 2015-03-04 DIAGNOSIS — M5136 Other intervertebral disc degeneration, lumbar region: Secondary | ICD-10-CM | POA: Insufficient documentation

## 2015-03-04 DIAGNOSIS — E785 Hyperlipidemia, unspecified: Secondary | ICD-10-CM | POA: Insufficient documentation

## 2015-03-04 DIAGNOSIS — K635 Polyp of colon: Secondary | ICD-10-CM | POA: Insufficient documentation

## 2015-03-13 ENCOUNTER — Other Ambulatory Visit: Payer: Self-pay

## 2015-03-13 MED ORDER — TIZANIDINE HCL 4 MG PO TABS: 4.0000 mg | ORAL_TABLET | Freq: Three times a day (TID) | ORAL | Status: DC | PRN

## 2015-03-26 ENCOUNTER — Other Ambulatory Visit: Payer: Self-pay

## 2015-03-26 MED ORDER — LOVASTATIN 20 MG PO TABS: 40.0000 mg | ORAL_TABLET | Freq: Every day | ORAL | Status: DC

## 2015-04-09 ENCOUNTER — Other Ambulatory Visit: Payer: Self-pay

## 2015-04-09 MED ORDER — FUROSEMIDE 20 MG PO TABS: 10.0000 mg | ORAL_TABLET | Freq: Two times a day (BID) | ORAL | Status: DC

## 2015-04-13 ENCOUNTER — Encounter: Payer: Self-pay | Admitting: Internal Medicine

## 2015-04-13 ENCOUNTER — Ambulatory Visit (INDEPENDENT_AMBULATORY_CARE_PROVIDER_SITE_OTHER): Payer: PPO | Admitting: Internal Medicine

## 2015-04-13 VITALS — BP 140/88 | HR 64 | Ht 60.5 in | Wt 108.2 lb

## 2015-04-13 DIAGNOSIS — E785 Hyperlipidemia, unspecified: Secondary | ICD-10-CM | POA: Diagnosis not present

## 2015-04-13 DIAGNOSIS — M5136 Other intervertebral disc degeneration, lumbar region: Secondary | ICD-10-CM | POA: Diagnosis not present

## 2015-04-13 DIAGNOSIS — H9191 Unspecified hearing loss, right ear: Secondary | ICD-10-CM | POA: Diagnosis not present

## 2015-04-13 DIAGNOSIS — H9190 Unspecified hearing loss, unspecified ear: Secondary | ICD-10-CM | POA: Insufficient documentation

## 2015-04-13 DIAGNOSIS — I10 Essential (primary) hypertension: Secondary | ICD-10-CM | POA: Diagnosis not present

## 2015-04-13 DIAGNOSIS — M810 Age-related osteoporosis without current pathological fracture: Secondary | ICD-10-CM

## 2015-04-13 DIAGNOSIS — D333 Benign neoplasm of cranial nerves: Secondary | ICD-10-CM

## 2015-04-13 DIAGNOSIS — Z23 Encounter for immunization: Secondary | ICD-10-CM

## 2015-04-13 DIAGNOSIS — M51369 Other intervertebral disc degeneration, lumbar region without mention of lumbar back pain or lower extremity pain: Secondary | ICD-10-CM

## 2015-04-13 DIAGNOSIS — Z0000 Encounter for general adult medical examination without abnormal findings: Secondary | ICD-10-CM | POA: Diagnosis not present

## 2015-04-13 DIAGNOSIS — Z1231 Encounter for screening mammogram for malignant neoplasm of breast: Secondary | ICD-10-CM | POA: Diagnosis not present

## 2015-04-13 LAB — POCT URINALYSIS DIPSTICK
BILIRUBIN UA: NEGATIVE
GLUCOSE UA: NEGATIVE
Leukocytes, UA: NEGATIVE
NITRITE UA: NEGATIVE
PH UA: 5
Protein, UA: NEGATIVE
RBC UA: NEGATIVE
SPEC GRAV UA: 1.01
Urobilinogen, UA: 0.2

## 2015-04-13 NOTE — Progress Notes (Signed)
Patient: Brittany Alvarez, Female    DOB: [birth year], 69 y.o.   MRN: [id number] Visit Date: 04/13/2015  Today's Provider: Halina Maidens, MD   Chief Complaint  Patient presents with  . Medicare Wellness  . Hypertension  . Hyperlipidemia   Subjective:    Annual wellness visit Brittany Alvarez is a 69 y.o. female who presents today for her Subsequent Annual Wellness Visit. She feels fairly well. She reports exercising none. She reports she is sleeping fairly well.   ----------------------------------------------------------- Hypertension This is a chronic problem. The current episode started more than 1 year ago. The problem is unchanged. The problem is controlled. Pertinent negatives include no chest pain, headaches, palpitations or shortness of breath. Past treatments include ACE inhibitors and diuretics.  Hyperlipidemia This is a chronic problem. The current episode started more than 1 year ago. The problem is controlled. Recent lipid tests were reviewed and are normal. Pertinent negatives include no chest pain, myalgias or shortness of breath. Current antihyperlipidemic treatment includes statins. The current treatment provides significant improvement of lipids.   acoustic schwannoma - patient reports having schwannoma removed from her right ear earlier this year. The eardrum was removed as well and a skin patch was placed. She now has complete hearing loss on that side. During the postop period she lost a significant amount of weight due to loss of taste. That has returned somewhat and she is working on improving her diet. Her main difficulty is consuming animal products to get sufficient protein. Low back pain - she is status post back surgery. She's no longer on narcotic pain medication. She has meloxicam to take intermittently and tizanidine to take at bedtime. Osteoporosis - patient has been on Fosamax for approximately 5 years. She has no side effects to medication and knows how to take it  appropriately. She is probably due for a bone density and will consider that next year. Review of Systems  Constitutional: Positive for unexpected weight change. Negative for chills, diaphoresis and fatigue.  HENT: Positive for hearing loss (on right). Negative for sinus pressure, tinnitus, trouble swallowing and voice change.   Eyes: Negative for visual disturbance.  Respiratory: Negative for cough, chest tightness, shortness of breath and wheezing.   Cardiovascular: Negative for chest pain, palpitations and leg swelling.  Gastrointestinal: Negative for abdominal pain, constipation, blood in stool and abdominal distention.  Endocrine: Negative for polydipsia and polyuria.  Genitourinary: Negative for dysuria, hematuria, vaginal bleeding and vaginal discharge.  Musculoskeletal: Positive for back pain. Negative for myalgias and joint swelling.  Skin: Negative for color change and rash.  Neurological: Negative for dizziness, syncope, numbness and headaches.  Psychiatric/Behavioral: Negative for sleep disturbance and dysphoric mood.    Social History   Social History  . Marital Status: Married    Spouse Name: N/A  . Number of Children: N/A  . Years of Education: N/A   Occupational History  . Not on file.   Social History Main Topics  . Smoking status: Never Smoker   . Smokeless tobacco: Not on file  . Alcohol Use: 0.0 oz/week    0 Standard drinks or equivalent per week     Comment: occassional  . Drug Use: No  . Sexual Activity: Not on file   Other Topics Concern  . Not on file   Social History Narrative    Patient Active Problem List   Diagnosis Date Noted  . Anxiety 03/04/2015  . Benign colonic polyp 03/04/2015  . Degeneration of intervertebral disc of lumbar  region 03/04/2015  . Dyslipidemia 03/04/2015  . Allergic state 03/04/2015  . Essential (primary) hypertension 03/04/2015  . Calcium blood increased 03/04/2015  . Arthritis of hand, degenerative 03/04/2015  . OP  (osteoporosis) 03/04/2015  . Acoustic neuroma ([place]) 03/04/2015    Past Surgical History  Procedure Laterality Date  . Cholecystectomy    . Appendectomy    . Ovary surgery      removal of left and left tube  . Back surgery      x 2  . Tonsillectomy    . Breast biopsy    . Anterior lat lumbar fusion  06/24/2011    Procedure: ANTERIOR LATERAL LUMBAR FUSION 1 LEVEL;  Surgeon: Faythe Ghee, MD;  Location: [facility];  Service: Neurosurgery;  Laterality: Right;  Right Lumbar Three-Four Anterior Lateral Lumbar Interbody Fusion with Right Lumbar Three-Four percutaneous pedicle screws (Lateral Postion for both procedures)  . Vestibular tumor resection Right 2016    schwannoma    Her family history includes Breast cancer in her mother; COPD in her father; Heart failure in her mother.    Previous Medications   ALENDRONATE (FOSAMAX) 70 MG TABLET    Take 1 tablet (70 mg total) by mouth every 7 (seven) days. Take with a full glass of water on an empty stomach. Taken on Sundays   ASPIRIN 81 MG TABLET    Take 81 mg by mouth daily.   BUSPIRONE (BUSPAR) 5 MG TABLET    Take 1 tablet (5 mg total) by mouth 3 (three) times daily. For anxiety   CALCIUM CARBONATE-VITAMIN D (CALCIUM + D) 600-200 MG-UNIT TABS    Take 1 tablet by mouth daily.   CHOLECALCIFEROL (VITAMIN D) 1000 UNITS TABLET    Take 1 tablet by mouth daily.   ESOMEPRAZOLE (NEXIUM 24HR) 20 MG CAPSULE    Take 1 capsule by mouth daily.   FLUTICASONE (FLONASE) 50 MCG/ACT NASAL SPRAY    Place 2 sprays into the nose daily.   FUROSEMIDE (LASIX) 20 MG TABLET    Take 0.5 tablets (10 mg total) by mouth 2 (two) times daily.   HYDROCODONE-ACETAMINOPHEN (NORCO) 5-325 MG PER TABLET    Take 1 tablet by mouth every 4 (four) hours as needed. For pain   LISINOPRIL (PRINIVIL,ZESTRIL) 20 MG TABLET    TAKE ONE (1) TABLET BY MOUTH EVERY DAY   LOVASTATIN (MEVACOR) 20 MG TABLET    Take 2 tablets (40 mg total) by mouth at bedtime.   MELOXICAM (MOBIC) 15 MG TABLET     Take 1 tablet by mouth daily as needed.   METHOCARBAMOL (ROBAXIN) 500 MG TABLET    Take 500 mg by mouth 2 (two) times daily as needed. For pain   OVER THE COUNTER MEDICATION    Take 1 tablet by mouth daily as needed. allergies   TIZANIDINE (ZANAFLEX) 4 MG TABLET    Take 1 tablet (4 mg total) by mouth every 8 (eight) hours as needed for muscle spasms.    Patient Care Team: Glean Hess, MD as PCP - General (Internal Medicine)     Objective:   Vitals: BP 140/88 mmHg  Pulse 64  Ht 5' 0.5" (1.537 m)  Wt 108 lb 3.2 oz (49.079 kg)  BMI 20.78 kg/m2  Physical Exam  Constitutional: She is oriented to person, place, and time. She appears well-developed and well-nourished. No distress.  HENT:  Head: Normocephalic and atraumatic.  Right Ear: Tympanic membrane and ear canal normal.  Left Ear: Tympanic membrane and  ear canal normal.  Nose: Right sinus exhibits no maxillary sinus tenderness. Left sinus exhibits no maxillary sinus tenderness.  Mouth/Throat: Uvula is midline and oropharynx is clear and moist.  Eyes: Conjunctivae and EOM are normal. Right eye exhibits no discharge. Left eye exhibits no discharge. No scleral icterus.  Neck: Normal range of motion. Carotid bruit is not present. No erythema present. No thyromegaly present.  Cardiovascular: Normal rate, regular rhythm, normal heart sounds and normal pulses.   Pulmonary/Chest: Effort normal and breath sounds normal. No respiratory distress. She has no wheezes. Right breast exhibits no mass, no nipple discharge, no skin change and no tenderness. Left breast exhibits no mass, no nipple discharge, no skin change and no tenderness.  Abdominal: Soft. Bowel sounds are normal. There is no hepatosplenomegaly. There is no tenderness. There is no CVA tenderness.  Musculoskeletal: Normal range of motion.  Lymphadenopathy:    She has no cervical adenopathy.    She has no axillary adenopathy.  Neurological: She is alert and oriented to person,  place, and time. She has normal reflexes. No cranial nerve deficit or sensory deficit.  Skin: Skin is warm, dry and intact. No rash noted.  Psychiatric: She has a normal mood and affect. Her speech is normal and behavior is normal. Thought content normal.  Nursing note and vitals reviewed.   Activities of Daily Living In your present state of health, do you have any difficulty performing the following activities: 04/13/2015  Hearing? Y  Vision? N  Difficulty concentrating or making decisions? N  Walking or climbing stairs? N  Dressing or bathing? N  Doing errands, shopping? N    Fall Risk Assessment Fall Risk  04/13/2015  Falls in the past year? No     Patient reports there are safety devices in place in shower at home.   Depression Screen PHQ 2/9 Scores 04/13/2015  PHQ - 2 Score 0    Cognitive Testing - 6-CIT   Correct? Score   What year is it? yes 0 Yes = 0    No = 4  What month is it? yes 0 Yes = 0    No = 3  Remember:     Pia Mau, [place], [place]     What time is it? yes 0 Yes = 0    No = 3  Count backwards from 20 to 1 yes 0 Correct = 0    1 error = 2   More than 1 error = 4  Say the months of the year in reverse. yes 0 Correct = 0    1 error = 2   More than 1 error = 4  What address did I ask you to remember? no 1 Correct = 0  1 error = 2    2 error = 4    3 error = 6    4 error = 8    All wrong = 10       TOTAL SCORE  1/28   Interpretation:  Normal  Normal (0-7) Abnormal (8-28)        Assessment & Plan:     Annual Wellness Visit  Reviewed patient's Family Medical History Reviewed and updated list of patient's medical providers Assessment of cognitive impairment was done Assessed patient's functional ability Established a written schedule for health screening [place] Completed and Reviewed  Exercise Activities and Dietary recommendations Goals    None      Immunization History  Administered  Date(s) Administered   . Influenza-Unspecified 01/24/2014, 02/25/2015  . Pneumococcal Polysaccharide-23 01/24/2013  . Zoster 05/27/2009    Health Maintenance  Topic Date Due  . Hepatitis C Screening  Jul 27, 1945  . TETANUS/TDAP  07/02/1966  . DEXA SCAN  07/02/2012  . PNA vac Low Risk Adult (2 of 2 - PCV13) 01/24/2014  . INFLUENZA VACCINE  12/25/2014  . MAMMOGRAM  03/29/2016  . COLONOSCOPY  05/27/2020  . ZOSTAVAX  Completed     Discussed health benefits of physical activity, and encouraged her to engage in regular exercise appropriate for her age and condition.    ------------------------------------------------------------------------------------------------------------  1. Medicare annual wellness visit, subsequent Medicare wellness measures are satisfied Patient declines tetanus booster and Hep C screening - POCT urinalysis dipstick  2. Essential (primary) hypertension Controlled on medication - CBC with Differential/Platelet - Comprehensive metabolic panel - TSH  3. Dyslipidemia Doing well on statin therapy - Lipid panel  4. Degeneration of intervertebral disc of lumbar region Continue intermittent use of nonsteroidals and muscle relaxants  5. OP (osteoporosis) On Fosamax therapy Consider bone density next year  6. Need for pneumococcal vaccination - Pneumococcal conjugate vaccine 13-valent IM  7. Encounter for screening mammogram for breast cancer - MM DIGITAL SCREENING BILATERAL; Future  8. Deaf, right Patient appears to be compensating well  9. Acoustic neuroma ([place]) Is post resection with residual deafness Patient is working to recover some weight that she lost after surgery Discussed high-protein foods in place of animal products   Halina Maidens, MD [facility]  04/13/2015

## 2015-04-13 NOTE — Patient Instructions (Addendum)
Health Maintenance  Topic Date Due  . Hepatitis C Screening  10-03-1945  . TETANUS/TDAP  07/02/1966  . DEXA SCAN  07/02/2012  . PNA vac Low Risk Adult (2 of 2 - PCV13) 01/24/2014  . INFLUENZA VACCINE  12/25/2014  . MAMMOGRAM  03/29/2016  . COLONOSCOPY  05/27/2020  . ZOSTAVAX  Completed   Pneumococcal Conjugate Vaccine (PCV13)  1. Why get vaccinated? Vaccination can protect both children and adults from pneumococcal disease. Pneumococcal disease is caused by bacteria that can spread from person to person through close contact. It can cause ear infections, and it can also lead to more serious infections of the:  Lungs (pneumonia),  Blood (bacteremia), and  Covering of the brain and spinal cord (meningitis). Pneumococcal pneumonia is most common among adults. Pneumococcal meningitis can cause deafness and brain damage, and it kills about 1 child in 10 who get it. Anyone can get pneumococcal disease, but children under 56 years of age and adults 50 years and older, people with certain medical conditions, and cigarette smokers are at the highest risk. Before there was a vaccine, the [place] saw:  more than 700 cases of meningitis,  about 13,000 blood infections,  about 5 million ear infections, and  about 200 deaths in children under 5 each year from pneumococcal disease. Since vaccine became available, severe pneumococcal disease in these children has fallen by 88%. About 18,000 older adults die of pneumococcal disease each year in the [place]. Treatment of pneumococcal infections with penicillin and other drugs is not as effective as it used to be, because some strains of the disease have become resistant to these drugs. This makes prevention of the disease, through vaccination, even more important. 2. PCV13 vaccine Pneumococcal conjugate vaccine (called PCV13) protects against 13 types of pneumococcal bacteria. PCV13 is routinely given to children at 2, 4, 6, and 51-90  months of age. It is also recommended for children and adults 67 to 67 years of age with certain health conditions, and for all adults 67 years of age and older. Your doctor can give you details. 3. Some people should not get this vaccine Anyone who has ever had a life-threatening allergic reaction to a dose of this vaccine, to an earlier pneumococcal vaccine called PCV7, or to any vaccine containing diphtheria toxoid (for example, DTaP), should not get PCV13. Anyone with a severe allergy to any component of PCV13 should not get the vaccine. Tell your doctor if the person being vaccinated has any severe allergies. If the person scheduled for vaccination is not feeling well, your healthcare provider might decide to reschedule the shot on another day. 4. Risks of a vaccine reaction With any medicine, including vaccines, there is a chance of reactions. These are usually mild and go away on their own, but serious reactions are also possible. Problems reported following PCV13 varied by age and dose in the series. The most common problems reported among children were:  About half became drowsy after the shot, had a temporary loss of appetite, or had redness or tenderness where the shot was given.  About 1 out of 3 had swelling where the shot was given.  About 1 out of 3 had a mild fever, and about 1 in 20 had a fever over 102.7F.  Up to about 8 out of 10 became fussy or irritable. Adults have reported pain, redness, and swelling where the shot was given; also mild fever, fatigue, headache, chills, or muscle pain. Young children who get PCV13  along with inactivated flu vaccine at the same time may be at increased risk for seizures caused by fever. Ask your doctor for more information. Problems that could happen after any vaccine:  People sometimes faint after a medical procedure, including vaccination. Sitting or lying down for about 15 minutes can help prevent fainting, and injuries caused by a fall.  Tell your doctor if you feel dizzy, or have vision changes or ringing in the ears.  Some older children and adults get severe pain in the shoulder and have difficulty moving the arm where a shot was given. This happens very rarely.  Any medication can cause a severe allergic reaction. Such reactions from a vaccine are very rare, estimated at about 1 in a million doses, and would happen within a few minutes to a few hours after the vaccination. As with any medicine, there is a very small chance of a vaccine causing a serious injury or death. The safety of vaccines is always being monitored. For more information, visit: http://www.aguilar.org/ 5. What if there is a serious reaction? What should I look for?  Look for anything that concerns you, such as signs of a severe allergic reaction, very high fever, or unusual behavior. Signs of a severe allergic reaction can include hives, swelling of the face and throat, difficulty breathing, a fast heartbeat, dizziness, and weakness-usually within a few minutes to a few hours after the vaccination. What should I do?  If you think it is a severe allergic reaction or other emergency that can't wait, call 9-1-1 or get the person to the nearest hospital. Otherwise, call your doctor. Reactions should be reported to the Vaccine Adverse Event Reporting System (VAERS). Your doctor should file this report, or you can do it yourself through the VAERS web site at www.vaers.SamedayNews.es, or by calling (450)457-6226. VAERS does not give medical advice. 6. The National Vaccine Injury Compensation Program The [employer] Vaccine Injury Compensation Program (VICP) is a federal program that was created to compensate people who may have been injured by certain vaccines. Persons who believe they may have been injured by a vaccine can learn about the program and about filing a claim by calling 931 434 7364 or visiting the [place] website at GoldCloset.com.ee. There is  a time limit to file a claim for compensation. 7. How can I learn more?  Ask your healthcare provider. He or she can give you the vaccine package insert or suggest other sources of information.  Call your local or state health department.  Contact the Centers for Disease Control and Prevention (CDC):  Call 405-546-8816 (1-800-CDC-INFO) or  Visit CDC's website at http://hunter.com/ Vaccine Information Statement PCV13 Vaccine (03/30/2014)   This information is not intended to replace advice given to you by your health care provider. Make sure you discuss any questions you have with your health care provider.   Document Released: 03/09/2006 Document Revised: 06/02/2014 Document Reviewed: 04/06/2014 Elsevier Interactive Patient Education [employer].

## 2015-04-14 LAB — CBC WITH DIFFERENTIAL/PLATELET
Basophils Absolute: 0 10*3/uL (ref 0.0–0.2)
Basos: 1 %
EOS (ABSOLUTE): 0.1 10*3/uL (ref 0.0–0.4)
EOS: 2 %
HEMATOCRIT: 41.9 % (ref 34.0–46.6)
HEMOGLOBIN: 13.9 g/dL (ref 11.1–15.9)
IMMATURE GRANULOCYTES: 0 %
Immature Grans (Abs): 0 10*3/uL (ref 0.0–0.1)
LYMPHS ABS: 2 10*3/uL (ref 0.7–3.1)
Lymphs: 38 %
MCH: 27.6 pg (ref 26.6–33.0)
MCHC: 33.2 g/dL (ref 31.5–35.7)
MCV: 83 fL (ref 79–97)
MONOS ABS: 0.4 10*3/uL (ref 0.1–0.9)
Monocytes: 7 %
NEUTROS PCT: 52 %
Neutrophils Absolute: 2.7 10*3/uL (ref 1.4–7.0)
Platelets: 360 10*3/uL (ref 150–379)
RBC: 5.04 x10E6/uL (ref 3.77–5.28)
RDW: 13.7 % (ref 12.3–15.4)
WBC: 5.3 10*3/uL (ref 3.4–10.8)

## 2015-04-14 LAB — COMPREHENSIVE METABOLIC PANEL
A/G RATIO: 1.9 (ref 1.1–2.5)
ALBUMIN: 4.9 g/dL — AB (ref 3.6–4.8)
ALK PHOS: 68 IU/L (ref 39–117)
ALT: 9 IU/L (ref 0–32)
AST: 22 IU/L (ref 0–40)
BILIRUBIN TOTAL: 0.5 mg/dL (ref 0.0–1.2)
BUN / CREAT RATIO: 20 (ref 11–26)
BUN: 15 mg/dL (ref 8–27)
CHLORIDE: 98 mmol/L (ref 97–106)
CO2: 23 mmol/L (ref 18–29)
CREATININE: 0.74 mg/dL (ref 0.57–1.00)
Calcium: 9.9 mg/dL (ref 8.7–10.3)
GFR calc Af Amer: 97 mL/min/{1.73_m2} (ref >59)
GFR calc non Af Amer: 84 mL/min/{1.73_m2} (ref >59)
GLOBULIN, TOTAL: 2.6 g/dL (ref 1.5–4.5)
Glucose: 82 mg/dL (ref 65–99)
POTASSIUM: 4.2 mmol/L (ref 3.5–5.2)
SODIUM: 141 mmol/L (ref 136–144)
Total Protein: 7.5 g/dL (ref 6.0–8.5)

## 2015-04-14 LAB — LIPID PANEL
CHOLESTEROL TOTAL: 207 mg/dL — AB (ref 100–199)
Chol/HDL Ratio: 2.5 ratio units (ref 0.0–4.4)
HDL: 82 mg/dL (ref >39)
LDL Calculated: 111 mg/dL — ABNORMAL HIGH (ref 0–99)
TRIGLYCERIDES: 72 mg/dL (ref 0–149)
VLDL Cholesterol Cal: 14 mg/dL (ref 5–40)

## 2015-04-14 LAB — TSH: TSH: 2.25 u[IU]/mL (ref 0.450–4.500)

## 2015-04-18 ENCOUNTER — Ambulatory Visit
Admission: RE | Admit: 2015-04-18 | Discharge: 2015-04-18 | Disposition: A | Discharge status: Home / Self Care | Payer: PPO | Source: Ambulatory Visit | Attending: Internal Medicine | Admitting: Internal Medicine

## 2015-04-18 DIAGNOSIS — Z1231 Encounter for screening mammogram for malignant neoplasm of breast: Secondary | ICD-10-CM | POA: Diagnosis present

## 2015-04-25 ENCOUNTER — Telehealth: Payer: Self-pay

## 2015-04-25 NOTE — Telephone Encounter (Signed)
Patient having trouble with back again, hurts, wants you to call in Prednisone like you " did it before ".dr

## 2015-04-25 NOTE — Telephone Encounter (Signed)
Patient informed.dr 

## 2015-04-25 NOTE — Telephone Encounter (Signed)
The last time I prescribed prednisone was 01/2014 and that was for a severe sinus infection.  I do not believe that prednisone is the appropriate treatment for her back pain.  She will need an appointment if she need something else.

## 2015-05-14 ENCOUNTER — Other Ambulatory Visit: Payer: Self-pay

## 2015-05-14 MED ORDER — TIZANIDINE HCL 4 MG PO TABS: 4.0000 mg | ORAL_TABLET | Freq: Three times a day (TID) | ORAL | Status: DC | PRN

## 2015-06-12 ENCOUNTER — Other Ambulatory Visit: Payer: Self-pay

## 2015-06-12 MED ORDER — ALENDRONATE SODIUM 70 MG PO TABS: 70.0000 mg | ORAL_TABLET | ORAL | Status: DC

## 2015-06-12 NOTE — Telephone Encounter (Signed)
Received fax requesting medication refill. Thanks

## 2015-06-22 DIAGNOSIS — S0502XA Injury of conjunctiva and corneal abrasion without foreign body, left eye, initial encounter: Secondary | ICD-10-CM | POA: Diagnosis not present

## 2015-07-10 ENCOUNTER — Other Ambulatory Visit: Payer: Self-pay | Admitting: Internal Medicine

## 2015-07-23 ENCOUNTER — Other Ambulatory Visit: Payer: Self-pay | Admitting: Internal Medicine

## 2015-08-20 DIAGNOSIS — I82611 Acute embolism and thrombosis of superficial veins of right upper extremity: Secondary | ICD-10-CM | POA: Diagnosis not present

## 2015-08-20 DIAGNOSIS — D333 Benign neoplasm of cranial nerves: Secondary | ICD-10-CM | POA: Diagnosis not present

## 2015-08-24 DIAGNOSIS — H40033 Anatomical narrow angle, bilateral: Secondary | ICD-10-CM | POA: Diagnosis not present

## 2015-09-11 DIAGNOSIS — H40033 Anatomical narrow angle, bilateral: Secondary | ICD-10-CM | POA: Diagnosis not present

## 2015-10-01 ENCOUNTER — Other Ambulatory Visit: Payer: Self-pay | Admitting: Internal Medicine

## 2015-10-05 NOTE — Telephone Encounter (Signed)
Patient scheduled for November 21 @ 930 am for fasting physical

## 2015-10-15 ENCOUNTER — Other Ambulatory Visit: Payer: Self-pay | Admitting: Internal Medicine

## 2015-11-14 DIAGNOSIS — Z86010 Personal history of colonic polyps: Secondary | ICD-10-CM | POA: Diagnosis not present

## 2016-01-14 ENCOUNTER — Encounter: Payer: Self-pay | Admitting: *Deleted

## 2016-01-15 ENCOUNTER — Encounter: Payer: Self-pay | Admitting: *Deleted

## 2016-01-15 ENCOUNTER — Encounter
Admission: RE | Disposition: A | Discharge status: Home / Self Care | Payer: Self-pay | Source: Ambulatory Visit | Attending: Gastroenterology

## 2016-01-15 ENCOUNTER — Ambulatory Visit: Payer: PPO | Admitting: Anesthesiology

## 2016-01-15 ENCOUNTER — Ambulatory Visit
Admission: RE | Admit: 2016-01-15 | Discharge: 2016-01-15 | Disposition: A | Discharge status: Home / Self Care | Payer: PPO | Source: Ambulatory Visit | Attending: Gastroenterology | Admitting: Gastroenterology

## 2016-01-15 DIAGNOSIS — M1990 Unspecified osteoarthritis, unspecified site: Secondary | ICD-10-CM | POA: Diagnosis not present

## 2016-01-15 DIAGNOSIS — I10 Essential (primary) hypertension: Secondary | ICD-10-CM | POA: Insufficient documentation

## 2016-01-15 DIAGNOSIS — K219 Gastro-esophageal reflux disease without esophagitis: Secondary | ICD-10-CM | POA: Insufficient documentation

## 2016-01-15 DIAGNOSIS — Z1211 Encounter for screening for malignant neoplasm of colon: Secondary | ICD-10-CM | POA: Insufficient documentation

## 2016-01-15 DIAGNOSIS — D123 Benign neoplasm of transverse colon: Secondary | ICD-10-CM | POA: Diagnosis not present

## 2016-01-15 DIAGNOSIS — Z8371 Family history of colonic polyps: Secondary | ICD-10-CM | POA: Insufficient documentation

## 2016-01-15 DIAGNOSIS — Z7982 Long term (current) use of aspirin: Secondary | ICD-10-CM | POA: Insufficient documentation

## 2016-01-15 DIAGNOSIS — D120 Benign neoplasm of cecum: Secondary | ICD-10-CM | POA: Diagnosis not present

## 2016-01-15 DIAGNOSIS — Z86010 Personal history of colonic polyps: Secondary | ICD-10-CM | POA: Diagnosis not present

## 2016-01-15 DIAGNOSIS — K5730 Diverticulosis of large intestine without perforation or abscess without bleeding: Secondary | ICD-10-CM | POA: Insufficient documentation

## 2016-01-15 DIAGNOSIS — D122 Benign neoplasm of ascending colon: Secondary | ICD-10-CM | POA: Diagnosis not present

## 2016-01-15 DIAGNOSIS — F419 Anxiety disorder, unspecified: Secondary | ICD-10-CM | POA: Insufficient documentation

## 2016-01-15 DIAGNOSIS — Z79899 Other long term (current) drug therapy: Secondary | ICD-10-CM | POA: Insufficient documentation

## 2016-01-15 DIAGNOSIS — K5790 Diverticulosis of intestine, part unspecified, without perforation or abscess without bleeding: Secondary | ICD-10-CM | POA: Diagnosis not present

## 2016-01-15 DIAGNOSIS — K635 Polyp of colon: Secondary | ICD-10-CM | POA: Diagnosis not present

## 2016-01-15 HISTORY — PX: COLONOSCOPY WITH PROPOFOL: SHX5780

## 2016-01-15 SURGERY — COLONOSCOPY WITH PROPOFOL
Anesthesia: General

## 2016-01-15 MED ORDER — SODIUM CHLORIDE 0.9 % IV SOLN: INTRAVENOUS | Status: DC

## 2016-01-15 MED ORDER — PHENYLEPHRINE HCL 10 MG/ML IJ SOLN
INTRAMUSCULAR | Status: DC | PRN
  Administered 2016-01-15: 100 ug via INTRAVENOUS

## 2016-01-15 MED ORDER — SODIUM CHLORIDE 0.9 % IV SOLN
INTRAVENOUS | Status: DC
  Administered 2016-01-15: 08:00:00 via INTRAVENOUS

## 2016-01-15 MED ORDER — MIDAZOLAM HCL 5 MG/5ML IJ SOLN
INTRAMUSCULAR | Status: DC | PRN
  Administered 2016-01-15: 1 mg via INTRAVENOUS

## 2016-01-15 MED ORDER — PROPOFOL 500 MG/50ML IV EMUL
INTRAVENOUS | Status: DC | PRN
  Administered 2016-01-15: 120 ug/kg/min via INTRAVENOUS

## 2016-01-15 MED ORDER — FENTANYL CITRATE (PF) 100 MCG/2ML IJ SOLN
INTRAMUSCULAR | Status: DC | PRN
  Administered 2016-01-15: 50 ug via INTRAVENOUS

## 2016-01-15 MED ORDER — PROPOFOL 10 MG/ML IV BOLUS
INTRAVENOUS | Status: DC | PRN
  Administered 2016-01-15: 100 mg via INTRAVENOUS

## 2016-01-15 MED ORDER — LIDOCAINE 2% (20 MG/ML) 5 ML SYRINGE
INTRAMUSCULAR | Status: DC | PRN
  Administered 2016-01-15: 40 mg via INTRAVENOUS

## 2016-01-15 NOTE — Anesthesia Postprocedure Evaluation (Signed)
Anesthesia Post Note  Patient: Brittany Alvarez  Procedure(s) Performed: Procedure(s) (LRB): COLONOSCOPY WITH PROPOFOL (N/A)  Patient location during evaluation: Endoscopy Anesthesia Type: General Level of consciousness: awake and alert and oriented Pain management: pain level controlled Vital Signs Assessment: post-procedure vital signs reviewed and stable Respiratory status: spontaneous breathing, nonlabored ventilation and respiratory function stable Cardiovascular status: blood pressure returned to baseline and stable Postop Assessment: no signs of nausea or vomiting Anesthetic complications: no    Last Vitals:  Vitals:   01/15/16 0919 01/15/16 0920  BP: (!) 99/59   Pulse: 69 69  Resp: 11 18  Temp: (!) 35.6 C     Last Pain:  Vitals:   01/15/16 0910  TempSrc: Tympanic                 Shaquan Puerta

## 2016-01-15 NOTE — Op Note (Signed)
[facility] Gastroenterology Patient Name: Brittany Alvarez Procedure Date: 01/15/2016 8:32 AM MRN: [id number] Account #: [id number] Date of Birth: February 10, 1946 Admit Type: Outpatient Age: 70 Room: [facility] ENDO ROOM 3 Gender: Female Note Status: Finalized Procedure:            Colonoscopy Indications:          Family history of colonic polyps in a first-degree                        relative Providers:            Lollie Sails, MD Referring MD:         Halina Maidens, MD (Referring MD) Medicines:            Monitored Anesthesia Care Complications:        No immediate complications. Procedure:            Pre-Anesthesia Assessment:                       - ASA Grade Assessment: II - A patient with mild                        systemic disease.                       After obtaining informed consent, the colonoscope was                        passed under direct vision. Throughout the procedure,                        the patient's blood pressure, pulse, and oxygen                        saturations were monitored continuously. The                        Colonoscope was introduced through the anus and                        advanced to the the cecum, identified by appendiceal                        orifice and ileocecal valve. The colonoscopy was                        performed without difficulty. The patient tolerated the                        procedure well. The quality of the bowel preparation                        was good. Findings:      A 3 mm polyp was found in the hepatic flexure. The polyp was sessile.       The polyp was removed with a cold biopsy forceps. Resection and       retrieval were complete.      A 4 mm polyp was found in the cecum. The polyp was sessile. The polyp       was removed with a cold snare. Resection and retrieval were complete.  Two flat polyps were found in the ascending colon. The polyps were 2 to       3 mm in size. These polyps  were removed with a cold biopsy forceps.       Resection and retrieval were complete.      Multiple small-mouthed diverticula were found in the sigmoid colon,       descending colon and transverse colon.      The retroflexed view of the distal rectum and anal verge was normal and       showed no anal or rectal abnormalities.      The exam was otherwise without abnormality.      The digital rectal exam was normal. Impression:           - One 3 mm polyp at the hepatic flexure, removed with a                        cold biopsy forceps. Resected and retrieved.                       - One 4 mm polyp in the cecum, removed with a cold                        snare. Resected and retrieved.                       - Two 2 to 3 mm polyps in the ascending colon, removed                        with a cold biopsy forceps. Resected and retrieved.                       - Diverticulosis in the sigmoid colon, in the                        descending colon and in the transverse colon.                       - The distal rectum and anal verge are normal on                        retroflexion view.                       - The examination was otherwise normal. Recommendation:       - Discharge patient to home.                       - Telephone GI clinic for pathology results in 1 week. Procedure Code(s):    --- Professional ---                       [phone], Colonoscopy, flexible; with removal of tumor(s),                        polyp(s), or other lesion(s) by snare technique                       [id number], 59, Colonoscopy, flexible; with biopsy, single  or multiple Diagnosis Code(s):    --- Professional ---                       D12.3, Benign neoplasm of transverse colon (hepatic                        flexure or splenic flexure)                       D12.0, Benign neoplasm of cecum                       D12.2, Benign neoplasm of ascending colon                       Z83.71, Family history of  colonic polyps                       K57.30, Diverticulosis of large intestine without                        perforation or abscess without bleeding CPT copyright 2016 American Medical Association. All rights reserved. The codes documented in this report are preliminary and upon coder review may  be revised to meet current compliance requirements. Lollie Sails, MD 01/15/2016 9:15:51 AM This report has been signed electronically. Number of Addenda: 0 Note Initiated On: 01/15/2016 8:32 AM Scope Withdrawal Time: 0 hours 13 minutes 9 seconds  Total Procedure Duration: 0 hours 24 minutes 48 seconds       [facility]

## 2016-01-15 NOTE — Anesthesia Preprocedure Evaluation (Signed)
Anesthesia Evaluation  Patient identified by MRN, date of birth, ID band Patient awake    Reviewed: Allergy & Precautions, NPO status , Patient's Chart, lab work & pertinent test results  History of Anesthesia Complications Negative for: history of anesthetic complications  Airway Mallampati: II  TM Distance: >3 FB Neck ROM: Full    Dental  (+) Poor Dentition   Pulmonary neg pulmonary ROS, neg sleep apnea, neg COPD,    breath sounds clear to auscultation- rhonchi (-) wheezing      Cardiovascular Exercise Tolerance: Good hypertension, Pt. on medications (-) CAD and (-) Past MI  Rhythm:Regular Rate:Normal - Systolic murmurs and - Diastolic murmurs    Neuro/Psych Anxiety    GI/Hepatic Neg liver ROS, GERD  Medicated,  Endo/Other  negative endocrine ROSneg diabetes  Renal/GU negative Renal ROS     Musculoskeletal  (+) Arthritis , Osteoarthritis,    Abdominal (+) - obese,   Peds  Hematology  (+) anemia ,   Anesthesia Other Findings Past Medical History: No date: Anemia No date: Blood transfusion     Comment: approx. 40 years ago No date: Gallstones     Comment: hx of No date: GERD (gastroesophageal reflux disease) No date: Hypertension     Comment: sees Dr. Army Melia in [place], [place]   Reproductive/Obstetrics                             Anesthesia Physical Anesthesia Plan  ASA: II  Anesthesia Plan: General   Post-op Pain Management:    Induction: Intravenous  Airway Management Planned: Natural Airway  Additional Equipment:   Intra-op Plan:   Post-operative Plan:   Informed Consent: I have reviewed the patients History and Physical, chart, labs and discussed the procedure including the risks, benefits and alternatives for the proposed anesthesia with the patient or authorized representative who has indicated his/her understanding and acceptance.   Dental advisory  given  Plan Discussed with: CRNA and Anesthesiologist  Anesthesia Plan Comments:         Anesthesia Quick Evaluation

## 2016-01-15 NOTE — Transfer of Care (Signed)
Immediate Anesthesia Transfer of Care Note  Patient: Brittany Alvarez  Procedure(s) Performed: Procedure(s): COLONOSCOPY WITH PROPOFOL (N/A)  Patient Location: PACU and Endoscopy Unit  Anesthesia Type:General  Level of Consciousness: sedated  Airway & Oxygen Therapy: Patient Spontanous Breathing and Patient connected to nasal cannula oxygen  Post-op Assessment: Report given to RN and Post -op Vital signs reviewed and stable  Post vital signs: Reviewed and stable  Last Vitals:  Vitals:   01/15/16 0734  BP: 135/86  Pulse: (!) 54  Resp: 16  Temp: 36.4 C    Last Pain:  Vitals:   01/15/16 0734  TempSrc: Tympanic         Complications: No apparent anesthesia complications

## 2016-01-15 NOTE — H&P (Signed)
Outpatient short stay form Pre-procedure 01/15/2016 8:29 AM Lollie Sails MD  Primary Physician: Dr. Halina Maidens  Reason for visit:  Colonoscopy  History of present illness:  Patient is a 70 year old female presenting today as above. She has a personal history of colon polyps in her brother, presenting today for colonoscopy due to family history colon Polyps. She did have some nausea and emesis of the prep last night however states she was going clear this morning. She does take a daily aspirin but has held that for about a week. She takes no other aspirin products or blood thinning agents.    Current Facility-Administered Medications:  .  0.9 %  sodium chloride infusion, , Intravenous, Continuous, Lollie Sails, MD, Last Rate: 20 mL/hr at 01/15/16 0749 .  0.9 %  sodium chloride infusion, , Intravenous, Continuous, Lollie Sails, MD  Prescriptions Prior to Admission  Medication Sig Dispense Refill Last Dose  . alendronate (FOSAMAX) 70 MG tablet Take 1 tablet (70 mg total) by mouth every 7 (seven) days. Take with a full glass of water on an empty stomach. Taken on Sundays 4 tablet 12 Past Week at Unknown time  . aspirin 81 MG tablet Take 81 mg by mouth daily.   Past Week at Unknown time  . furosemide (LASIX) 20 MG tablet TAKE ONE-HALF TABLET BY MOUTH TWICE DAILY AS DIRECTED 30 tablet 5 Past Week at Unknown time  . lisinopril (PRINIVIL,ZESTRIL) 20 MG tablet TAKE ONE (1) TABLET BY MOUTH EVERY DAY 30 tablet 12 01/15/2016 at 0600  . lovastatin (MEVACOR) 20 MG tablet Take 2 tablets (40 mg total) by mouth at bedtime. 60 tablet 5 Past Week at Unknown time  . tiZANidine (ZANAFLEX) 4 MG tablet TAKE ONE TABLET BY MOUTH EVERY EIGHT HOURS AS NEEDED FOR MUSCLE SPASM 90 tablet 3 Past Week at Unknown time  . busPIRone (BUSPAR) 5 MG tablet TAKE ONE (1) TABLET BY MOUTH 3 TIMES DAILY FOR ANXIETY 90 tablet 3   . esomeprazole (NEXIUM 24HR) 20 MG capsule Take 1 capsule by mouth daily.   Not Taking at  Unknown time  . fluticasone (FLONASE) 50 MCG/ACT nasal spray Place 2 sprays into the nose daily.   Not Taking at Unknown time  . HYDROcodone-acetaminophen (NORCO) 5-325 MG per tablet Take 1 tablet by mouth every 4 (four) hours as needed. For pain   Not Taking     Allergies  Allergen Reactions  . Povidone Iodine Rash  . Latex Hives    Patient states painful and rash  . Thiazide-Type Diuretics     Hypercalcemia     Past Medical History:  Diagnosis Date  . Anemia   . Blood transfusion    approx. 40 years ago  . Gallstones    hx of  . GERD (gastroesophageal reflux disease)   . Hypertension    sees Dr. Army Melia in [place], [place] [phone]    Review of systems:      Physical Exam    Heart and lungs: Regular rate and rhythm without rub or gallop, lungs are bilaterally clear.    HEENT: Norm cephalic atraumatic eyes are anicteric    Other:     Pertinant exam for procedure: Soft nontender nondistended bowel sounds positive normoactive.    Planned proceedures: Colonoscopy and indicated procedures. I have discussed the risks benefits and complications of procedures to include not limited to bleeding, infection, perforation and the risk of sedation and the patient wishes to proceed.    Lollie Sails,  MD Gastroenterology 01/15/2016  8:29 AM

## 2016-01-16 ENCOUNTER — Encounter: Payer: Self-pay | Admitting: Gastroenterology

## 2016-01-16 ENCOUNTER — Encounter: Payer: Self-pay | Admitting: Internal Medicine

## 2016-01-16 LAB — SURGICAL PATHOLOGY

## 2016-02-25 ENCOUNTER — Other Ambulatory Visit: Payer: Self-pay | Admitting: Internal Medicine

## 2016-03-17 ENCOUNTER — Other Ambulatory Visit: Payer: Self-pay | Admitting: Internal Medicine

## 2016-03-24 ENCOUNTER — Other Ambulatory Visit: Payer: Self-pay | Admitting: Internal Medicine

## 2016-04-15 ENCOUNTER — Encounter: Payer: Self-pay | Admitting: Internal Medicine

## 2016-04-15 ENCOUNTER — Ambulatory Visit (INDEPENDENT_AMBULATORY_CARE_PROVIDER_SITE_OTHER): Payer: PPO | Admitting: Internal Medicine

## 2016-04-15 VITALS — BP 132/80 | HR 76 | Ht 60.0 in | Wt 106.6 lb

## 2016-04-15 DIAGNOSIS — I10 Essential (primary) hypertension: Secondary | ICD-10-CM | POA: Diagnosis not present

## 2016-04-15 DIAGNOSIS — M810 Age-related osteoporosis without current pathological fracture: Secondary | ICD-10-CM | POA: Diagnosis not present

## 2016-04-15 DIAGNOSIS — Z1231 Encounter for screening mammogram for malignant neoplasm of breast: Secondary | ICD-10-CM | POA: Diagnosis not present

## 2016-04-15 DIAGNOSIS — Z0000 Encounter for general adult medical examination without abnormal findings: Secondary | ICD-10-CM

## 2016-04-15 DIAGNOSIS — E785 Hyperlipidemia, unspecified: Secondary | ICD-10-CM

## 2016-04-15 DIAGNOSIS — Z23 Encounter for immunization: Secondary | ICD-10-CM | POA: Diagnosis not present

## 2016-04-15 LAB — POCT URINALYSIS DIPSTICK
BILIRUBIN UA: NEGATIVE
Blood, UA: NEGATIVE
Glucose, UA: NEGATIVE
Ketones, UA: NEGATIVE
LEUKOCYTES UA: NEGATIVE
NITRITE UA: NEGATIVE
PH UA: 5
PROTEIN UA: NEGATIVE
Spec Grav, UA: <=1.005
UROBILINOGEN UA: 0.2

## 2016-04-15 MED ORDER — BUSPIRONE HCL 5 MG PO TABS: ORAL_TABLET | ORAL | 5 refills | Status: DC

## 2016-04-15 MED ORDER — FUROSEMIDE 20 MG PO TABS: ORAL_TABLET | ORAL | 5 refills | Status: DC

## 2016-04-15 MED ORDER — TIZANIDINE HCL 4 MG PO TABS: 4.0000 mg | ORAL_TABLET | Freq: Three times a day (TID) | ORAL | 5 refills | Status: DC | PRN

## 2016-04-15 NOTE — Progress Notes (Signed)
Patient: Brittany Alvarez, Female    DOB: 04-03-1946, 70 y.o.   MRN: [id number] Visit Date: 04/15/2016  Today's Provider: Halina Maidens, MD   Chief Complaint  Patient presents with  . Annual Exam    needs mammo   Subjective:    Annual wellness visit Brittany Alvarez is a 70 y.o. female who presents today for her Subsequent Annual Wellness Visit. She feels well. She reports exercising walking. She reports she is sleeping fairly well.   ----------------------------------------------------------- Hypertension  This is a chronic problem. The current episode started more than 1 year ago. The problem is unchanged. The problem is controlled. Pertinent negatives include no chest pain, headaches, palpitations or shortness of breath.  Hyperlipidemia  This is a chronic problem. The problem is controlled. Pertinent negatives include no chest pain or shortness of breath. Current antihyperlipidemic treatment includes statins. The current treatment provides significant improvement of lipids. There are no compliance problems.   Osteoporosis - she continues on Fosamax weekly with no side effects.  She is not taking calcium due to hx of high serum calcium.  She is not taking vitamin D and has not had a level checked.  There have been no fractures.  Last DEXA 2012.  Review of Systems  Constitutional: Negative for chills, fatigue and fever.  HENT: Negative for congestion, hearing loss, tinnitus, trouble swallowing and voice change.   Eyes: Negative for visual disturbance.  Respiratory: Negative for cough, chest tightness, shortness of breath and wheezing.   Cardiovascular: Negative for chest pain, palpitations and leg swelling.  Gastrointestinal: Negative for abdominal pain, constipation, diarrhea and vomiting.  Endocrine: Negative for polydipsia and polyuria.  Genitourinary: Negative for dysuria, frequency, genital sores, vaginal bleeding and vaginal discharge.  Musculoskeletal: Negative for arthralgias,  gait problem and joint swelling.  Skin: Negative for color change and rash.  Neurological: Negative for dizziness, tremors, light-headedness and headaches.  Hematological: Negative for adenopathy. Does not bruise/bleed easily.  Psychiatric/Behavioral: Negative for dysphoric mood and sleep disturbance. The patient is not nervous/anxious.     Social History   Social History  . Marital status: Married    Spouse name: N/A  . Number of children: N/A  . Years of education: N/A   Occupational History  . Not on file.   Social History Main Topics  . Smoking status: Never Smoker  . Smokeless tobacco: Never Used  . Alcohol use 0.0 oz/week     Comment: occassional  . Drug use: No  . Sexual activity: Not on file   Other Topics Concern  . Not on file   Social History Narrative  . No narrative on file    Patient Active Problem List   Diagnosis Date Noted  . Deaf 04/13/2015  . Anxiety 03/04/2015  . Benign colonic polyp 03/04/2015  . Degeneration of intervertebral disc of lumbar region 03/04/2015  . Dyslipidemia 03/04/2015  . Allergic state 03/04/2015  . Essential (primary) hypertension 03/04/2015  . Calcium blood increased 03/04/2015  . Arthritis of hand, degenerative 03/04/2015  . Osteoporosis without current pathological fracture 03/04/2015  . Vestibular schwannoma ([place]) 03/04/2015    Past Surgical History:  Procedure Laterality Date  . ANTERIOR LAT LUMBAR FUSION  06/24/2011   Procedure: ANTERIOR LATERAL LUMBAR FUSION 1 LEVEL;  Surgeon: Faythe Ghee, MD;  Location: [facility];  Service: Neurosurgery;  Laterality: Right;  Right Lumbar Three-Four Anterior Lateral Lumbar Interbody Fusion with Right Lumbar Three-Four percutaneous pedicle screws (Lateral Postion for both procedures)  . APPENDECTOMY    .  BACK SURGERY     x 2  . BREAST BIOPSY Left age 26's  . BREAST CYST ASPIRATION Left 1990  . CHOLECYSTECTOMY    . COLONOSCOPY WITH PROPOFOL N/A 01/15/2016   Procedure:  COLONOSCOPY WITH PROPOFOL;  Surgeon: Lollie Sails, MD;  Location[facility] ENDOSCOPY;  Service: Endoscopy;  Laterality: N/A;  . OVARY SURGERY     removal of left and left tube  . TONSILLECTOMY    . vestibular tumor resection Right 2016   schwannoma    Her family history includes Breast cancer (age of onset: 32) in her mother; COPD in her father; Heart failure in her mother.     Previous Medications   ALENDRONATE (FOSAMAX) 70 MG TABLET    Take 1 tablet (70 mg total) by mouth every 7 (seven) days. Take with a full glass of water on an empty stomach. Taken on Sundays   ASPIRIN 81 MG TABLET    Take 81 mg by mouth daily.   LISINOPRIL (PRINIVIL,ZESTRIL) 20 MG TABLET    TAKE ONE (1) TABLET BY MOUTH EVERY DAY   LOVASTATIN (MEVACOR) 20 MG TABLET    TAKE TWO TABLETS BY MOUTH EVERY NIGHT AT BEDTIME   PROBIOTIC PRODUCT (SOLUBLE FIBER/PROBIOTICS PO)    Take by mouth.    Patient Care Team: Glean Hess, MD as PCP - General (Internal Medicine)      Objective:   Vitals: BP 132/80   Pulse 76   Ht 5' (1.524 m)   Wt 106 lb 9.6 oz (48.4 kg)   BMI 20.82 kg/m   Physical Exam  Constitutional: She is oriented to person, place, and time. She appears well-developed and well-nourished. No distress.  HENT:  Head: Normocephalic and atraumatic.  Right Ear: Tympanic membrane and ear canal normal.  Left Ear: Tympanic membrane and ear canal normal.  Nose: Right sinus exhibits no maxillary sinus tenderness. Left sinus exhibits no maxillary sinus tenderness.  Mouth/Throat: Uvula is midline and oropharynx is clear and moist.  Eyes: Conjunctivae and EOM are normal. Right eye exhibits no discharge. Left eye exhibits no discharge. No scleral icterus.  Neck: Normal range of motion. Neck supple. Carotid bruit is not present. No erythema present. No thyromegaly present.  Cardiovascular: Normal rate, regular rhythm, normal heart sounds and normal pulses.   Pulmonary/Chest: Effort normal. No respiratory distress.  She has no wheezes. Right breast exhibits no mass, no nipple discharge, no skin change and no tenderness. Left breast exhibits no mass, no nipple discharge, no skin change and no tenderness.  Abdominal: Soft. Bowel sounds are normal. There is no hepatosplenomegaly. There is no tenderness. There is no CVA tenderness.  Musculoskeletal: Normal range of motion. She exhibits no edema or tenderness.  Lymphadenopathy:    She has no cervical adenopathy.    She has no axillary adenopathy.  Neurological: She is alert and oriented to person, place, and time. She has normal reflexes. No cranial nerve deficit or sensory deficit.  Skin: Skin is warm, dry and intact. No rash noted.  Psychiatric: She has a normal mood and affect. Her speech is normal and behavior is normal. Thought content normal.  Nursing note and vitals reviewed.   Activities of Daily Living In your present state of health, do you have any difficulty performing the following activities: 04/15/2016  Hearing? N  Vision? N  Difficulty concentrating or making decisions? N  Walking or climbing stairs? N  Dressing or bathing? N  Doing errands, shopping? N  Preparing Food and eating ?  N  Using the Toilet? N  In the past six months, have you accidently leaked urine? N  Do you have problems with loss of bowel control? N  Managing your Medications? N  Managing your Finances? N  Housekeeping or managing your Housekeeping? N  Some recent data might be hidden    Fall Risk Assessment Fall Risk  04/15/2016 04/13/2015  Falls in the past year? No No      Depression Screen PHQ 2/9 Scores 04/15/2016 04/13/2015  PHQ - 2 Score 0 0   6CIT Screen 04/15/2016  What Year? 0 points  What month? 0 points  What time? 0 points  Count back from 20 0 points  Months in reverse 2 points  Repeat phrase 0 points  Total Score 2     Medicare Annual Wellness Visit Summary:  Reviewed patient's Family Medical History Reviewed and updated list of  patient's medical providers Assessment of cognitive impairment was done Assessed patient's functional ability Established a written schedule for health screening [place] Completed and Reviewed  Exercise Activities and Dietary recommendations Goals    None      Immunization History  Administered Date(s) Administered  . Influenza-Unspecified 01/24/2014, 02/25/2015  . Pneumococcal Conjugate-13 04/13/2015  . Pneumococcal Polysaccharide-23 01/24/2013  . Tdap 04/15/2016  . Zoster 05/27/2009    Health Maintenance  Topic Date Due  . Samul Dada  07/02/1966  . Hepatitis C Screening  05/27/2023 (Originally 1946/03/15)  . MAMMOGRAM  04/22/2017  . COLONOSCOPY  01/14/2026  . INFLUENZA VACCINE  Addressed  . DEXA SCAN  Addressed  . ZOSTAVAX  Completed  . PNA vac Low Risk Adult  Completed    Discussed health benefits of physical activity, and encouraged her to engage in regular exercise appropriate for her age and condition.    ------------------------------------------------------------------------------------------------------------  Assessment & Plan:  1. Medicare annual wellness visit, subsequent Measures satisfied - POCT urinalysis dipstick  2. Encounter for screening mammogram for breast cancer - MM DIGITAL SCREENING BILATERAL; Future  3. Essential (primary) hypertension controlled - CBC with Differential/Platelet - TSH - furosemide (LASIX) 20 MG tablet; One a day  Dispense: 30 tablet; Refill: 5  4. Osteoporosis without current pathological fracture, unspecified osteoporosis type Check level and advise on supplementation - VITAMIN D 25 Hydroxy (Vit-D Deficiency, Fractures)  5. Dyslipidemia On statin therapy - Lipid panel  6. Calcium blood increased Check labs and advise if supplement needed - Comprehensive metabolic panel  7. Need for diphtheria-tetanus-pertussis (Tdap) vaccine - Tdap vaccine greater than or equal to 7yo IM   Halina Maidens, MD [facility]  04/15/2016

## 2016-04-15 NOTE — Patient Instructions (Addendum)
Health Maintenance  Topic Date Due  . TETANUS/TDAP  07/02/1966  . Hepatitis C Screening  05/27/2023 (Originally Feb 25, 1946)  . MAMMOGRAM  04/22/2017  . COLONOSCOPY  01/14/2026  . INFLUENZA VACCINE  Addressed  . DEXA SCAN  Addressed  . ZOSTAVAX  Completed  . PNA vac Low Risk Adult  Completed    Breast Self-Awareness Introduction Breast self-awareness means being familiar with how your breasts look and feel. It involves checking your breasts regularly and reporting any changes to your health care provider. Practicing breast self-awareness is important. A change in your breasts can be a sign of a serious medical problem. Being familiar with how your breasts look and feel allows you to find any problems early, when treatment is more likely to be successful. All women should practice breast self-awareness, including women who have had breast implants. How to do a breast self-exam One way to learn what is normal for your breasts and whether your breasts are changing is to do a breast self-exam. To do a breast self-exam: Look for Changes  1. Remove all the clothing above your waist. 2. Stand in front of a mirror in a room with good lighting. 3. Put your hands on your hips. 4. Push your hands firmly downward. 5. Compare your breasts in the mirror. Look for differences between them (asymmetry), such as:  Differences in shape.  Differences in size.  Puckers, dips, and bumps in one breast and not the other. 6. Look at each breast for changes in your skin, such as:  Redness.  Scaly areas. 7. Look for changes in your nipples, such as:  Discharge.  Bleeding.  Dimpling.  Redness.  A change in position. Feel for Changes  Carefully feel your breasts for lumps and changes. It is best to do this while lying on your back on the floor and again while sitting or standing in the shower or tub with soapy water on your skin. Feel each breast in the following way:  Place the arm on the side of  the breast you are examining above your head.  Feel your breast with the other hand.  Start in the nipple area and make  inch (2 cm) overlapping circles to feel your breast. Use the pads of your three middle fingers to do this. Apply light pressure, then medium pressure, then firm pressure. The light pressure will allow you to feel the tissue closest to the skin. The medium pressure will allow you to feel the tissue that is a little deeper. The firm pressure will allow you to feel the tissue close to the ribs.  Continue the overlapping circles, moving downward over the breast until you feel your ribs below your breast.  Move one finger-width toward the center of the body. Continue to use the  inch (2 cm) overlapping circles to feel your breast as you move slowly up toward your collarbone.  Continue the up and down exam using all three pressures until you reach your armpit. Write Down What You Find  Write down what is normal for each breast and any changes that you find. Keep a written record with breast changes or normal findings for each breast. By writing this information down, you do not need to depend only on memory for size, tenderness, or location. Write down where you are in your menstrual cycle, if you are still menstruating. If you are having trouble noticing differences in your breasts, do not get discouraged. With time you will become more familiar with  the variations in your breasts and more comfortable with the exam. How often should I examine my breasts? Examine your breasts every month. If you are breastfeeding, the best time to examine your breasts is after a feeding or after using a breast pump. If you menstruate, the best time to examine your breasts is 5-7 days after your period is over. During your period, your breasts are lumpier, and it may be more difficult to notice changes. When should I see my health care provider? See your health care provider if you notice:  A change  in shape or size of your breasts or nipples.  A change in the skin of your breast or nipples, such as a reddened or scaly area.  Unusual discharge from your nipples.  A lump or thick area that was not there before.  Pain in your breasts.  Anything that concerns you. This information is not intended to replace advice given to you by your health care provider. Make sure you discuss any questions you have with your health care provider. Document Released: 05/12/2005 Document Revised: 10/18/2015 Document Reviewed: 04/01/2015  2017 Elsevier DTaP Vaccine (Diphtheria, Tetanus, and Pertussis): What You Need to Know 1. Why get vaccinated? Diphtheria, tetanus, and pertussis are serious diseases caused by bacteria. Diphtheria and pertussis are spread from person to person. Tetanus enters the body through cuts or wounds. DIPHTHERIA causes a thick covering in the back of the throat.  It can lead to breathing problems, paralysis, heart failure, and even death. TETANUS (Lockjaw) causes painful tightening of the muscles, usually all over the body.  It can lead to "locking" of the jaw so the victim cannot open his mouth or swallow. Tetanus leads to death in up to 2 out of 10 cases. PERTUSSIS (Whooping Cough) causes coughing spells so bad that it is hard for infants to eat, drink, or breathe. These spells can last for weeks.  It can lead to pneumonia, seizures (jerking and staring spells), brain damage, and death. Diphtheria, tetanus, and pertussis vaccine (DTaP) can help prevent these diseases. Most children who are vaccinated with DTaP will be protected throughout childhood. Many more children would get these diseases if we stopped vaccinating. DTaP is a safer version of an older vaccine called DTP. DTP is no longer used in the [place]. 2. Who should get DTaP vaccine and when? Children should get 5 doses of DTaP vaccine, one dose at each of the following ages:  2 months  4 months  6  months  15-18 months  4-6 years DTaP may be given at the same time as other vaccines. 3. Some children should not get DTaP vaccine or should wait  Children with minor illnesses, such as a cold, may be vaccinated. But children who are moderately or severely ill should usually wait until they recover before getting DTaP vaccine.  Any child who had a life-threatening allergic reaction after a dose of DTaP should not get another dose.  Any child who suffered a brain or nervous system disease within 7 days after a dose of DTaP should not get another dose.  Talk with your doctor if your child:  had a seizure or collapsed after a dose of DTaP,  cried non-stop for 3 hours or more after a dose of DTaP,  had a fever over 105F after a dose of DTaP. Ask your doctor for more information. Some of these children should not get another dose of pertussis vaccine, but may get a vaccine without pertussis, called  DT. 4. Older children and adults DTaP is not licensed for adolescents, adults, or children 17 years of age and older. But older people still need protection. A vaccine called Tdap is similar to DTaP. A single dose of Tdap is recommended for people 11 through 68 years of age. Another vaccine, called Td, protects against tetanus and diphtheria, but not pertussis. It is recommended every 10 years. There are separate Vaccine Information Statements for these vaccines. 5. What are the risks from DTaP vaccine? Getting diphtheria, tetanus, or pertussis disease is much riskier than getting DTaP vaccine. However, a vaccine, like any medicine, is capable of causing serious problems, such as severe allergic reactions. The risk of DTaP vaccine causing serious harm, or death, is extremely small. Mild problems (common)  Fever (up to about 1 child in 4)  Redness or swelling where the shot was given (up to about 1 child in 4)  Soreness or tenderness where the shot was given (up to about 1 child in 4) These  problems occur more often after the 4th and 5th doses of the DTaP series than after earlier doses. Sometimes the 4th or 5th dose of DTaP vaccine is followed by swelling of the entire arm or leg in which the shot was given, lasting 1-7 days (up to about 1 child in 6). Other mild problems include:  Fussiness (up to about 1 child in 3)  Tiredness or poor appetite (up to about 1 child in 10)  Vomiting (up to about 1 child in 61) These problems generally occur 1-3 days after the shot. Moderate problems (uncommon)  Seizure (jerking or staring) (about 1 child out of 14,000)  Non-stop crying, for 3 hours or more (up to about 1 child out of 1,000)  High fever, over 105F (about 1 child out of 16,000) Severe problems (very rare)  Serious allergic reaction (less than 1 out of a million doses)  Several other severe problems have been reported after DTaP vaccine. These include:  Long-term seizures, coma, or lowered consciousness  Permanent brain damage. These are so rare it is hard to tell if they are caused by the vaccine. Controlling fever is especially important for children who have had seizures, for any reason. It is also important if another family member has had seizures. You can reduce fever and pain by giving your child an aspirin-free pain reliever when the shot is given, and for the next 24 hours, following the package instructions. 6. What if there is a serious reaction? What should I look for? Look for anything that concerns you, such as signs of a severe allergic reaction, very high fever, or behavior changes. Signs of a severe allergic reaction can include hives, swelling of the face and throat, difficulty breathing, a fast heartbeat, dizziness, and weakness. These would start a few minutes to a few hours after the vaccination. What should I do?  If you think it is a severe allergic reaction or other emergency that can't wait, call 9-1-1 or get the person to the nearest hospital.  Otherwise, call your doctor.  Afterward, the reaction should be reported to the Vaccine Adverse Event Reporting System (VAERS). Your doctor might file this report, or you can do it yourself through the VAERS web site at www.vaers.SamedayNews.es, or by calling 9807485107.  VAERS is only for reporting reactions. They do not give medical advice. 7. The National Vaccine Injury Compensation Program The [employer] Vaccine Injury Compensation Program (VICP) is a federal program that was created to compensate people  who may have been injured by certain vaccines. Persons who believe they may have been injured by a vaccine can learn about the program and about filing a claim by calling 334-638-5141 or visiting the [place] website at GoldCloset.com.ee. 8. How can I learn more?  Ask your doctor.  Call your local or state health department.  Contact the Centers for Disease Control and Prevention (CDC):  Call (607)071-8492 (1-800-CDC-INFO) or  Visit CDC's website at http://hunter.com/ CDC DTaP Vaccine (Diphtheria, Tetanus, and Pertussis) VIS (10/09/05) This information is not intended to replace advice given to you by your health care provider. Make sure you discuss any questions you have with your health care provider. Document Released: 03/09/2006 Document Revised: 01/31/2016 Document Reviewed: 01/31/2016 Elsevier Interactive Patient Education  2017 [employer].

## 2016-04-16 LAB — CBC WITH DIFFERENTIAL/PLATELET
BASOS: 1 %
Basophils Absolute: 0 10*3/uL (ref 0.0–0.2)
EOS (ABSOLUTE): 0.1 10*3/uL (ref 0.0–0.4)
Eos: 2 %
Hematocrit: 42.2 % (ref 34.0–46.6)
Hemoglobin: 13.4 g/dL (ref 11.1–15.9)
Immature Grans (Abs): 0 10*3/uL (ref 0.0–0.1)
Immature Granulocytes: 0 %
Lymphocytes Absolute: 2.2 10*3/uL (ref 0.7–3.1)
Lymphs: 44 %
MCH: 26.8 pg (ref 26.6–33.0)
MCHC: 31.8 g/dL (ref 31.5–35.7)
MCV: 84 fL (ref 79–97)
MONOS ABS: 0.4 10*3/uL (ref 0.1–0.9)
Monocytes: 7 %
NEUTROS ABS: 2.3 10*3/uL (ref 1.4–7.0)
Neutrophils: 46 %
PLATELETS: 332 10*3/uL (ref 150–379)
RBC: 5 x10E6/uL (ref 3.77–5.28)
RDW: 14.1 % (ref 12.3–15.4)
WBC: 5.1 10*3/uL (ref 3.4–10.8)

## 2016-04-16 LAB — COMPREHENSIVE METABOLIC PANEL
A/G RATIO: 2.1 (ref 1.2–2.2)
ALT: 9 IU/L (ref 0–32)
AST: 22 IU/L (ref 0–40)
Albumin: 5 g/dL — ABNORMAL HIGH (ref 3.6–4.8)
Alkaline Phosphatase: 65 IU/L (ref 39–117)
BILIRUBIN TOTAL: 0.5 mg/dL (ref 0.0–1.2)
BUN/Creatinine Ratio: 25 (ref 12–28)
BUN: 20 mg/dL (ref 8–27)
CHLORIDE: 98 mmol/L (ref 96–106)
CO2: 23 mmol/L (ref 18–29)
Calcium: 9.9 mg/dL (ref 8.7–10.3)
Creatinine, Ser: 0.8 mg/dL (ref 0.57–1.00)
GFR calc non Af Amer: 76 mL/min/{1.73_m2} (ref >59)
GFR, EST AFRICAN AMERICAN: 88 mL/min/{1.73_m2} (ref >59)
Globulin, Total: 2.4 g/dL (ref 1.5–4.5)
Glucose: 77 mg/dL (ref 65–99)
POTASSIUM: 4.3 mmol/L (ref 3.5–5.2)
Sodium: 142 mmol/L (ref 134–144)
Total Protein: 7.4 g/dL (ref 6.0–8.5)

## 2016-04-16 LAB — VITAMIN D 25 HYDROXY (VIT D DEFICIENCY, FRACTURES): VIT D 25 HYDROXY: 34.1 ng/mL (ref 30.0–100.0)

## 2016-04-16 LAB — LIPID PANEL
Chol/HDL Ratio: 2.5 ratio units (ref 0.0–4.4)
Cholesterol, Total: 223 mg/dL — ABNORMAL HIGH (ref 100–199)
HDL: 88 mg/dL (ref >39)
LDL Calculated: 121 mg/dL — ABNORMAL HIGH (ref 0–99)
TRIGLYCERIDES: 70 mg/dL (ref 0–149)
VLDL Cholesterol Cal: 14 mg/dL (ref 5–40)

## 2016-04-16 LAB — TSH: TSH: 2.3 u[IU]/mL (ref 0.450–4.500)

## 2016-05-27 ENCOUNTER — Ambulatory Visit: Payer: BC Managed Care – PPO

## 2016-06-23 ENCOUNTER — Ambulatory Visit
Admission: RE | Admit: 2016-06-23 | Discharge: 2016-06-23 | Disposition: A | Discharge status: Home / Self Care | Payer: PPO | Source: Ambulatory Visit | Attending: Internal Medicine | Admitting: Internal Medicine

## 2016-06-23 DIAGNOSIS — Z1231 Encounter for screening mammogram for malignant neoplasm of breast: Secondary | ICD-10-CM | POA: Insufficient documentation

## 2016-06-29 ENCOUNTER — Other Ambulatory Visit: Payer: Self-pay | Admitting: Internal Medicine

## 2016-08-18 ENCOUNTER — Other Ambulatory Visit: Payer: Self-pay | Admitting: Internal Medicine

## 2016-11-03 DIAGNOSIS — D333 Benign neoplasm of cranial nerves: Secondary | ICD-10-CM | POA: Diagnosis not present

## 2016-11-03 DIAGNOSIS — Z9889 Other specified postprocedural states: Secondary | ICD-10-CM | POA: Diagnosis not present

## 2016-11-03 DIAGNOSIS — I82C11 Acute embolism and thrombosis of right internal jugular vein: Secondary | ICD-10-CM | POA: Diagnosis not present

## 2016-11-10 ENCOUNTER — Other Ambulatory Visit: Payer: Self-pay | Admitting: Internal Medicine

## 2016-11-10 DIAGNOSIS — I10 Essential (primary) hypertension: Secondary | ICD-10-CM

## 2016-12-22 ENCOUNTER — Encounter: Payer: Self-pay | Admitting: Family Medicine

## 2016-12-22 ENCOUNTER — Ambulatory Visit (INDEPENDENT_AMBULATORY_CARE_PROVIDER_SITE_OTHER): Payer: PPO | Admitting: Family Medicine

## 2016-12-22 VITALS — BP 148/90 | HR 66 | Ht 60.0 in | Wt 114.0 lb

## 2016-12-22 DIAGNOSIS — B029 Zoster without complications: Secondary | ICD-10-CM | POA: Diagnosis not present

## 2016-12-22 MED ORDER — VALACYCLOVIR HCL 1 G PO TABS: 1000.0000 mg | ORAL_TABLET | Freq: Two times a day (BID) | ORAL | 0 refills | Status: DC

## 2016-12-22 MED ORDER — TRAMADOL HCL 50 MG PO TABS: 50.0000 mg | ORAL_TABLET | Freq: Three times a day (TID) | ORAL | 0 refills | Status: DC | PRN

## 2016-12-22 MED ORDER — PREDNISONE 10 MG PO TABS: 10.0000 mg | ORAL_TABLET | Freq: Every day | ORAL | 0 refills | Status: DC

## 2016-12-22 NOTE — Patient Instructions (Signed)
Shingles Shingles, which is also known as herpes zoster, is an infection that causes a painful skin rash and fluid-filled blisters. Shingles is not related to genital herpes, which is a sexually transmitted infection. Shingles only develops in people who:  Have had chickenpox.  Have received the chickenpox vaccine. (This is rare.)  What are the causes? Shingles is caused by varicella-zoster virus (VZV). This is the same virus that causes chickenpox. After exposure to VZV, the virus stays in the body in an inactive (dormant) state. Shingles develops if the virus reactivates. This can happen many years after the initial exposure to VZV. It is not known what causes this virus to reactivate. What increases the risk? People who have had chickenpox or received the chickenpox vaccine are at risk for shingles. Infection is more common in people who:  Are older than age 50.  Have a weakened defense (immune) system, such as those with HIV, AIDS, or cancer.  Are taking medicines that weaken the immune system, such as transplant medicines.  Are under great stress.  What are the signs or symptoms? Early symptoms of this condition include itching, tingling, and pain in an area on your skin. Pain may be described as burning, stabbing, or throbbing. A few days or weeks after symptoms start, a painful red rash appears, usually on one side of the body in a bandlike or beltlike pattern. The rash eventually turns into fluid-filled blisters that break open, scab over, and dry up in about 2-3 weeks. At any time during the infection, you may also develop:  A fever.  Chills.  A headache.  An upset stomach.  How is this diagnosed? This condition is diagnosed with a skin exam. Sometimes, skin or fluid samples are taken from the blisters before a diagnosis is made. These samples are examined under a microscope or sent to a lab for testing. How is this treated? There is no specific cure for this condition.  Your health care provider will probably prescribe medicines to help you manage pain, recover more quickly, and avoid long-term problems. Medicines may include:  Antiviral drugs.  Anti-inflammatory drugs.  Pain medicines.  If the area involved is on your face, you may be referred to a specialist, such as an eye doctor (ophthalmologist) or an ear, nose, and throat (ENT) doctor to help you avoid eye problems, chronic pain, or disability. Follow these instructions at home: Medicines  Take medicines only as directed by your health care provider.  Apply an anti-itch or numbing cream to the affected area as directed by your health care provider. Blister and Rash Care  Take a cool bath or apply cool compresses to the area of the rash or blisters as directed by your health care provider. This may help with pain and itching.  Keep your rash covered with a loose bandage (dressing). Wear loose-fitting clothing to help ease the pain of material rubbing against the rash.  Keep your rash and blisters clean with mild soap and cool water or as directed by your health care provider.  Check your rash every day for signs of infection. These include redness, swelling, and pain that lasts or increases.  Do not pick your blisters.  Do not scratch your rash. General instructions  Rest as directed by your health care provider.  Keep all follow-up visits as directed by your health care provider. This is important.  Until your blisters scab over, your infection can cause chickenpox in people who have never had it or been vaccinated   against it. To prevent this from happening, avoid contact with other people, especially: ? Babies. ? Pregnant women. ? Children who have eczema. ? Elderly people who have transplants. ? People who have chronic illnesses, such as leukemia or AIDS. Contact a health care provider if:  Your pain is not relieved with prescribed medicines.  Your pain does not get better after  the rash heals.  Your rash looks infected. Signs of infection include redness, swelling, and pain that lasts or increases. Get help right away if:  The rash is on your face or nose.  You have facial pain, pain around your eye area, or loss of feeling on one side of your face.  You have ear pain or you have ringing in your ear.  You have loss of taste.  Your condition gets worse. This information is not intended to replace advice given to you by your health care provider. Make sure you discuss any questions you have with your health care provider. Document Released: 05/12/2005 Document Revised: 01/06/2016 Document Reviewed: 03/23/2014 Elsevier Interactive Patient Education  2017 Elsevier Inc.  

## 2016-12-22 NOTE — Progress Notes (Signed)
Name: Brittany Alvarez   MRN: [id number]    DOB: March 13, 1946   Date:12/22/2016       Progress Note  Subjective  Chief Complaint  Chief Complaint  Patient presents with  . Rash    Located on right side- hip area. Has not changed detergent, soap, or lotions. Pt states its so painful she has been wearing pajamas for two weeks. Started small and grew bigger. Painful to touch- and itches. Very red and immflammed.  Removed 2 ticks within the last two weeks in the same area. They were not attached to skin, just crawling around.     Rash  This is a new problem. The current episode started 1 to 4 weeks ago. The problem has been gradually worsening since onset. The affected locations include the right upper leg. The rash is characterized by redness and burning (vesicular earlier). She was exposed to nothing. Pertinent negatives include no cough, diarrhea, fever, joint pain, shortness of breath or sore throat. Past treatments include topical steroids and antihistamine. The treatment provided mild relief.    No problem-specific Assessment & Plan notes found for this encounter.   Past Medical History:  Diagnosis Date  . Anemia   . Anxiety   . Blood transfusion    approx. 40 years ago  . Gallstones    hx of  . GERD (gastroesophageal reflux disease)   . Hyperlipidemia   . Hypertension    sees Dr. Army Melia in [place], [place]    Past Surgical History:  Procedure Laterality Date  . ANTERIOR LAT LUMBAR FUSION  06/24/2011   Procedure: ANTERIOR LATERAL LUMBAR FUSION 1 LEVEL;  Surgeon: Faythe Ghee, MD;  Location: [facility];  Service: Neurosurgery;  Laterality: Right;  Right Lumbar Three-Four Anterior Lateral Lumbar Interbody Fusion with Right Lumbar Three-Four percutaneous pedicle screws (Lateral Postion for both procedures)  . APPENDECTOMY    . BACK SURGERY     x 2  . BREAST CYST ASPIRATION Left 1990   NEG  . BREAST EXCISIONAL BIOPSY Left age 34's   NEG  . CHOLECYSTECTOMY    .  COLONOSCOPY WITH PROPOFOL N/A 01/15/2016   Procedure: COLONOSCOPY WITH PROPOFOL;  Surgeon: Lollie Sails, MD;  Location: [facility];  Service: Endoscopy;  Laterality: N/A;  . OVARY SURGERY     removal of left and left tube  . TONSILLECTOMY    . vestibular tumor resection Right 2016   schwannoma    Family History  Problem Relation Age of Onset  . COPD Father   . Breast cancer Mother 40  . Heart failure Mother     Social History   Social History  . Marital status: Married    Spouse name: N/A  . Number of children: N/A  . Years of education: N/A   Occupational History  . Not on file.   Social History Main Topics  . Smoking status: Never Smoker  . Smokeless tobacco: Never Used  . Alcohol use 0.0 oz/week     Comment: occassional  . Drug use: No  . Sexual activity: Not on file   Other Topics Concern  . Not on file   Social History Narrative  . No narrative on file    Allergies  Allergen Reactions  . Povidone Iodine Rash  . Latex Hives    Patient states painful and rash  . Thiazide-Type Diuretics     Hypercalcemia    Outpatient Medications Prior to Visit  Medication Sig Dispense Refill  . alendronate (  FOSAMAX) 70 MG tablet TAKE 1 TABLET BY MOUTH EVERY 7 DAYS. TAKE WITH A FULL GLASS OF WATER ON AN EMPTY STOMACH. TAKE ON SUNDAYS 4 tablet 12  . aspirin 81 MG tablet Take 81 mg by mouth daily.    . busPIRone (BUSPAR) 5 MG tablet TAKE ONE TABLET BY MOUTH 3 TIMES DAILY FOR ANXIETY 90 tablet 5  . furosemide (LASIX) 20 MG tablet TAKE ONE (1) TABLET BY MOUTH EVERY DAY 30 tablet 5  . lisinopril (PRINIVIL,ZESTRIL) 20 MG tablet TAKE ONE (1) TABLET BY MOUTH EVERY DAY 30 tablet 5  . lovastatin (MEVACOR) 20 MG tablet TAKE TWO TABLETS BY MOUTH EVERY NIGHT AT BEDTIME 60 tablet 12  . Probiotic Product (SOLUBLE FIBER/PROBIOTICS PO) Take by mouth.    Marland Kitchen tiZANidine (ZANAFLEX) 4 MG tablet Take 1 tablet (4 mg total) by mouth every 8 (eight) hours as needed for muscle spasms. 90  tablet 5   No facility-administered medications prior to visit.     Review of Systems  Constitutional: Negative for chills, fever, malaise/fatigue and weight loss.  HENT: Negative for ear discharge, ear pain and sore throat.   Eyes: Negative for blurred vision.  Respiratory: Negative for cough, sputum production, shortness of breath and wheezing.   Cardiovascular: Negative for chest pain, palpitations and leg swelling.  Gastrointestinal: Negative for abdominal pain, blood in stool, constipation, diarrhea, heartburn, melena and nausea.  Genitourinary: Negative for dysuria, frequency, hematuria and urgency.  Musculoskeletal: Negative for back pain, joint pain, myalgias and neck pain.  Skin: Positive for rash.  Neurological: Negative for dizziness, tingling, sensory change, focal weakness and headaches.  Endo/Heme/Allergies: Negative for environmental allergies and polydipsia. Does not bruise/bleed easily.  Psychiatric/Behavioral: Negative for depression and suicidal ideas. The patient is not nervous/anxious and does not have insomnia.      Objective  Vitals:   12/22/16 1006  BP: (!) 148/90  Pulse: 66  SpO2: 99%  Weight: 114 lb (51.7 kg)  Height: 5' (1.524 m)    Physical Exam  Constitutional: She is well-developed, well-nourished, and in no distress. No distress.  HENT:  Head: Normocephalic and atraumatic.  Right Ear: External ear normal.  Left Ear: External ear normal.  Nose: Nose normal.  Mouth/Throat: Oropharynx is clear and moist.  Eyes: Pupils are equal, round, and reactive to light. Conjunctivae and EOM are normal. Right eye exhibits no discharge. Left eye exhibits no discharge.  Neck: Normal range of motion. Neck supple. No JVD present. No thyromegaly present.  Cardiovascular: Normal rate, regular rhythm, normal heart sounds and intact distal pulses.  Exam reveals no gallop and no friction rub.   No murmur heard. Pulmonary/Chest: Effort normal and breath sounds normal.  She has no wheezes. She has no rales.  Abdominal: Soft. Bowel sounds are normal. She exhibits no mass. There is no tenderness. There is no guarding.  Musculoskeletal: Normal range of motion. She exhibits no edema.  Lymphadenopathy:    She has no cervical adenopathy.  Neurological: She is alert. She has normal reflexes.  Skin: Skin is warm and dry. Rash noted. She is not diaphoretic. There is erythema.  Psychiatric: Mood and affect normal.  Nursing note and vitals reviewed.     Assessment & Plan  Problem List Items Addressed This Visit    None    Visit Diagnoses    Herpes zoster without complication    -  Primary   Relevant Medications   valACYclovir (VALTREX) 1000 MG tablet   predniSONE (DELTASONE) 10 MG tablet  traMADol (ULTRAM) 50 MG tablet      Meds ordered this encounter  Medications  . valACYclovir (VALTREX) 1000 MG tablet    Sig: Take 1 tablet (1,000 mg total) by mouth 2 (two) times daily.    Dispense:  21 tablet    Refill:  0  . predniSONE (DELTASONE) 10 MG tablet    Sig: Take 1 tablet (10 mg total) by mouth daily with breakfast. Taper 4,4,4,3,3,3,2,2,2,1,1,1    Dispense:  30 tablet    Refill:  0  . traMADol (ULTRAM) 50 MG tablet    Sig: Take 1 tablet (50 mg total) by mouth every 8 (eight) hours as needed.    Dispense:  30 tablet    Refill:  0    Control site accessed      Dr. Otilio Miu [facility]  12/22/16

## 2017-01-05 ENCOUNTER — Telehealth: Payer: Self-pay | Admitting: Internal Medicine

## 2017-01-05 ENCOUNTER — Ambulatory Visit (INDEPENDENT_AMBULATORY_CARE_PROVIDER_SITE_OTHER): Payer: PPO

## 2017-01-05 VITALS — BP 162/78 | HR 80 | Temp 97.8°F | Resp 16 | Ht 60.0 in | Wt 115.4 lb

## 2017-01-05 DIAGNOSIS — Z0000 Encounter for general adult medical examination without abnormal findings: Secondary | ICD-10-CM | POA: Diagnosis not present

## 2017-01-05 NOTE — Progress Notes (Signed)
Subjective:   Brittany Alvarez is a 71 y.o. female who presents for Medicare Annual (Subsequent) preventive examination.  Review of Systems:   Cardiac Risk Factors include: advanced age (>56men, >78 women);hypertension;dyslipidemia     Objective:     Vitals: BP (!) 162/78 (BP Location: Right Arm, Patient Position: Sitting)   Pulse 80   Temp 97.8 F (36.6 C)   Resp 16   Ht 5' (1.524 m)   Wt 115 lb 6.4 oz (52.3 kg)   BMI 22.54 kg/m   Body mass index is 22.54 kg/m.   Tobacco History  Smoking Status  . Never Smoker  Smokeless Tobacco  . Never Used     Counseling given: Not Answered   Past Medical History:  Diagnosis Date  . Anemia   . Anxiety   . Blood transfusion    approx. 40 years ago  . Gallstones    hx of  . GERD (gastroesophageal reflux disease)   . Hyperlipidemia   . Hypertension    sees Dr. Army Melia in [place], [place]   Past Surgical History:  Procedure Laterality Date  . ANTERIOR LAT LUMBAR FUSION  06/24/2011   Procedure: ANTERIOR LATERAL LUMBAR FUSION 1 LEVEL;  Surgeon: Faythe Ghee, MD;  Location: [facility];  Service: Neurosurgery;  Laterality: Right;  Right Lumbar Three-Four Anterior Lateral Lumbar Interbody Fusion with Right Lumbar Three-Four percutaneous pedicle screws (Lateral Postion for both procedures)  . APPENDECTOMY    . BACK SURGERY     x 2  . BREAST CYST ASPIRATION Left 1990   NEG  . BREAST EXCISIONAL BIOPSY Left age 77's   NEG  . CHOLECYSTECTOMY    . COLONOSCOPY WITH PROPOFOL N/A 01/15/2016   Procedure: COLONOSCOPY WITH PROPOFOL;  Surgeon: Lollie Sails, MD;  Location: [facility] ENDOSCOPY;  Service: Endoscopy;  Laterality: N/A;  . OVARY SURGERY     removal of left and left tube  . TONSILLECTOMY    . vestibular tumor resection Right 2016   schwannoma   Family History  Problem Relation Age of Onset  . COPD Father   . Breast cancer Mother 9  . Heart failure Mother    History  Sexual Activity  . Sexual activity:  Not on file    Outpatient Encounter Prescriptions as of 01/05/2017  Medication Sig  . alendronate (FOSAMAX) 70 MG tablet TAKE 1 TABLET BY MOUTH EVERY 7 DAYS. TAKE WITH A FULL GLASS OF WATER ON AN EMPTY STOMACH. TAKE ON SUNDAYS  . aspirin 81 MG tablet Take 81 mg by mouth daily.  . busPIRone (BUSPAR) 5 MG tablet TAKE ONE TABLET BY MOUTH 3 TIMES DAILY FOR ANXIETY  . furosemide (LASIX) 20 MG tablet TAKE ONE (1) TABLET BY MOUTH EVERY DAY  . lisinopril (PRINIVIL,ZESTRIL) 20 MG tablet TAKE ONE (1) TABLET BY MOUTH EVERY DAY  . lovastatin (MEVACOR) 20 MG tablet TAKE TWO TABLETS BY MOUTH EVERY NIGHT AT BEDTIME  . Probiotic Product (SOLUBLE FIBER/PROBIOTICS PO) Take by mouth.  Marland Kitchen tiZANidine (ZANAFLEX) 4 MG tablet Take 1 tablet (4 mg total) by mouth every 8 (eight) hours as needed for muscle spasms.  . traMADol (ULTRAM) 50 MG tablet Take 1 tablet (50 mg total) by mouth every 8 (eight) hours as needed. (Patient not taking: Reported on 01/05/2017)  . [DISCONTINUED] predniSONE (DELTASONE) 10 MG tablet Take 1 tablet (10 mg total) by mouth daily with breakfast. Taper 4,4,4,3,3,3,2,2,2,1,1,1 (Patient not taking: Reported on 01/05/2017)  . [DISCONTINUED] valACYclovir (VALTREX) 1000 MG tablet Take  1 tablet (1,000 mg total) by mouth 2 (two) times daily. (Patient not taking: Reported on 01/05/2017)   No facility-administered encounter medications on file as of 01/05/2017.     Activities of Daily Living In your present state of health, do you have any difficulty performing the following activities: 01/05/2017 04/15/2016  Hearing? Y N  Comment ruptured ear drum in right ear -  Vision? N N  Difficulty concentrating or making decisions? N N  Walking or climbing stairs? N N  Dressing or bathing? N N  Doing errands, shopping? N N  Preparing Food and eating ? N N  Using the Toilet? N N  In the past six months, have you accidently leaked urine? N N  Do you have problems with loss of bowel control? N N  Managing your  Medications? N N  Managing your Finances? N N  Housekeeping or managing your Housekeeping? N N  Some recent data might be hidden    Patient Care Team: Glean Hess, MD as PCP - General (Internal Medicine)    Assessment:     Exercise Activities and Dietary recommendations Current Exercise Habits: The patient has a physically strenous job, but has no regular exercise apart from work. (house work, yard work.), Exercise limited by: None identified  Goals    . Increase water intake          Recommend drinking at least 4-5 glasses of water a day       Fall Risk Fall Risk  01/05/2017 04/15/2016 04/13/2015  Falls in the past year? No No No   Depression Screen PHQ 2/9 Scores 01/05/2017 04/15/2016 04/13/2015  PHQ - 2 Score 0 0 0     Cognitive Function     6CIT Screen 01/05/2017 04/15/2016  What Year? 0 points 0 points  What month? 0 points 0 points  What time? 0 points 0 points  Count back from 20 0 points 0 points  Months in reverse 0 points 2 points  Repeat phrase 0 points 0 points  Total Score 0 2    Immunization History  Administered Date(s) Administered  . Influenza-Unspecified 01/24/2014, 02/25/2015  . Pneumococcal Conjugate-13 04/13/2015  . Pneumococcal Polysaccharide-23 01/24/2013  . Tdap 04/15/2016  . Zoster 05/27/2009   Screening Tests Health Maintenance  Topic Date Due  . INFLUENZA VACCINE  12/24/2016  . Hepatitis C Screening  05/27/2023 (Originally 12/10/1945)  . MAMMOGRAM  06/23/2018  . COLONOSCOPY  01/14/2026  . TETANUS/TDAP  04/15/2026  . DEXA SCAN  Addressed  . PNA vac Low Risk Adult  Completed      Plan:     I have personally reviewed and addressed the Medicare Annual Wellness questionnaire and have noted the following in the patient's chart:  A. Medical and social history B. Use of alcohol, tobacco or illicit drugs  C. Current medications and supplements D. Functional ability and status E.  Nutritional status F.  Physical  activity G. Advance directives H. List of other physicians I.  Hospitalizations, surgeries, and ER visits in previous 12 months J.  [place] such as hearing and vision if needed, cognitive and depression L. Referrals and appointments   In addition, I have reviewed and discussed with patient certain preventive protocols, quality metrics, and best practice recommendations. A written personalized care plan for preventive services as well as general preventive health recommendations were provided to patient.   Signed,  Tyler Aas, LPN Nurse Health Advisor   MD Recommendations:none

## 2017-01-05 NOTE — Patient Instructions (Signed)
[name] , Thank you for taking time to come for your Medicare Wellness Visit. I appreciate your ongoing commitment to your health goals. Please review the following plan we discussed and let me know if I can assist you in the future.   Screening recommendations/referrals: Colonoscopy: Completed 01/15/2016 Mammogram: Completed 06/23/2016 Bone Density: completed 05/26/2010 Recommended yearly ophthalmology/optometry visit for glaucoma screening and checkup Recommended yearly dental visit for hygiene and checkup  Vaccinations: Influenza vaccine: up to date, due 01/2017 Pneumococcal vaccine: up to date Tdap vaccine: up to date Shingles vaccine: up to date  Advanced directives: Please bring a copy of your health care power of attorney and living will to the office at your convenience.  Conditions/risks identified: Recommend drinking at least 4-5 glasses of water a day   Next appointment: Follow up in one year for your annual wellness exam.   Preventive Care 65 Years and Older, Female Preventive care refers to lifestyle choices and visits with your health care provider that can promote health and wellness. What does preventive care include?  A yearly physical exam. This is also called an annual well check.  Dental exams once or twice a year.  Routine eye exams. Ask your health care provider how often you should have your eyes checked.  Personal lifestyle choices, including:  Daily care of your teeth and gums.  Regular physical activity.  Eating a healthy diet.  Avoiding tobacco and drug use.  Limiting alcohol use.  Practicing safe sex.  Taking low-dose aspirin every day.  Taking vitamin and mineral supplements as recommended by your health care provider. What happens during an annual well check? The services and screenings done by your health care provider during your annual well check will depend on your age, overall health, lifestyle risk factors, and family history of  disease. Counseling  Your health care provider may ask you questions about your:  Alcohol use.  Tobacco use.  Drug use.  Emotional well-being.  Home and relationship well-being.  Sexual activity.  Eating habits.  History of falls.  Memory and ability to understand (cognition).  Work and work Statistician.  Reproductive health. Screening  You may have the following tests or measurements:  Height, weight, and BMI.  Blood pressure.  Lipid and cholesterol levels. These may be checked every 5 years, or more frequently if you are over 36 years old.  Skin check.  Lung cancer screening. You may have this screening every year starting at age 16 if you have a 30-pack-year history of smoking and currently smoke or have quit within the past 15 years.  Fecal occult blood test (FOBT) of the stool. You may have this test every year starting at age 64.  Flexible sigmoidoscopy or colonoscopy. You may have a sigmoidoscopy every 5 years or a colonoscopy every 10 years starting at age 68.  Hepatitis C blood test.  Hepatitis B blood test.  Sexually transmitted disease (STD) testing.  Diabetes screening. This is done by checking your blood sugar (glucose) after you have not eaten for a while (fasting). You may have this done every 1-3 years.  Bone density scan. This is done to screen for osteoporosis. You may have this done starting at age 76.  Mammogram. This may be done every 1-2 years. Talk to your health care provider about how often you should have regular mammograms. Talk with your health care provider about your test results, treatment options, and if necessary, the need for more tests. Vaccines  Your health care provider  may recommend certain vaccines, such as:  Influenza vaccine. This is recommended every year.  Tetanus, diphtheria, and acellular pertussis (Tdap, Td) vaccine. You may need a Td booster every 10 years.  Zoster vaccine. You may need this after age  83.  Pneumococcal 13-valent conjugate (PCV13) vaccine. One dose is recommended after age 14.  Pneumococcal polysaccharide (PPSV23) vaccine. One dose is recommended after age 78. Talk to your health care provider about which screenings and vaccines you need and how often you need them. This information is not intended to replace advice given to you by your health care provider. Make sure you discuss any questions you have with your health care provider. Document Released: 06/08/2015 Document Revised: 01/30/2016 Document Reviewed: 03/13/2015 Elsevier Interactive Patient Education  2017 [place] Prevention in the Home Falls can cause injuries. They can happen to people of all ages. There are many things you can do to make your home safe and to help prevent falls. What can I do on the outside of my home?  Regularly fix the edges of walkways and driveways and fix any cracks.  Remove anything that might make you trip as you walk through a door, such as a raised step or threshold.  Trim any bushes or trees on the path to your home.  Use bright outdoor lighting.  Clear any walking paths of anything that might make someone trip, such as rocks or tools.  Regularly check to see if handrails are loose or broken. Make sure that both sides of any steps have handrails.  Any raised decks and porches should have guardrails on the edges.  Have any leaves, snow, or ice cleared regularly.  Use sand or salt on walking paths during winter.  Clean up any spills in your garage right away. This includes oil or grease spills. What can I do in the bathroom?  Use night lights.  Install grab bars by the toilet and in the tub and shower. Do not use towel bars as grab bars.  Use non-skid mats or decals in the tub or shower.  If you need to sit down in the shower, use a plastic, non-slip stool.  Keep the floor dry. Clean up any water that spills on the floor as soon as it happens.  Remove  soap buildup in the tub or shower regularly.  Attach bath mats securely with double-sided non-slip rug tape.  Do not have throw rugs and other things on the floor that can make you trip. What can I do in the bedroom?  Use night lights.  Make sure that you have a light by your bed that is easy to reach.  Do not use any sheets or blankets that are too big for your bed. They should not hang down onto the floor.  Have a firm chair that has side arms. You can use this for support while you get dressed.  Do not have throw rugs and other things on the floor that can make you trip. What can I do in the kitchen?  Clean up any spills right away.  Avoid walking on wet floors.  Keep items that you use a lot in easy-to-reach places.  If you need to reach something above you, use a strong step stool that has a grab bar.  Keep electrical cords out of the way.  Do not use floor polish or wax that makes floors slippery. If you must use wax, use non-skid floor wax.  Do not have throw rugs  and other things on the floor that can make you trip. What can I do with my stairs?  Do not leave any items on the stairs.  Make sure that there are handrails on both sides of the stairs and use them. Fix handrails that are broken or loose. Make sure that handrails are as long as the stairways.  Check any carpeting to make sure that it is firmly attached to the stairs. Fix any carpet that is loose or worn.  Avoid having throw rugs at the top or bottom of the stairs. If you do have throw rugs, attach them to the floor with carpet tape.  Make sure that you have a light switch at the top of the stairs and the bottom of the stairs. If you do not have them, ask someone to add them for you. What else can I do to help prevent falls?  Wear shoes that:  Do not have high heels.  Have rubber bottoms.  Are comfortable and fit you well.  Are closed at the toe. Do not wear sandals.  If you use a  stepladder:  Make sure that it is fully opened. Do not climb a closed stepladder.  Make sure that both sides of the stepladder are locked into place.  Ask someone to hold it for you, if possible.  Clearly mark and make sure that you can see:  Any grab bars or handrails.  First and last steps.  Where the edge of each step is.  Use tools that help you move around (mobility aids) if they are needed. These include:  Canes.  Walkers.  Scooters.  Crutches.  Turn on the lights when you go into a dark area. Replace any light bulbs as soon as they burn out.  Set up your furniture so you have a clear path. Avoid moving your furniture around.  If any of your floors are uneven, fix them.  If there are any pets around you, be aware of where they are.  Review your medicines with your doctor. Some medicines can make you feel dizzy. This can increase your chance of falling. Ask your doctor what other things that you can do to help prevent falls. This information is not intended to replace advice given to you by your health care provider. Make sure you discuss any questions you have with your health care provider. Document Released: 03/08/2009 Document Revised: 10/18/2015 Document Reviewed: 06/16/2014 Elsevier Interactive Patient Education  2017 [employer].

## 2017-01-05 NOTE — Telephone Encounter (Signed)
Pt wants Pharmacy to be changes to [facility] in [place]

## 2017-01-23 DIAGNOSIS — H2513 Age-related nuclear cataract, bilateral: Secondary | ICD-10-CM | POA: Diagnosis not present

## 2017-02-10 ENCOUNTER — Other Ambulatory Visit: Payer: Self-pay | Admitting: Internal Medicine

## 2017-02-10 MED ORDER — LISINOPRIL 20 MG PO TABS: 20.0000 mg | ORAL_TABLET | Freq: Every day | ORAL | 5 refills | Status: DC

## 2017-03-25 ENCOUNTER — Other Ambulatory Visit: Payer: Self-pay

## 2017-03-25 MED ORDER — BUSPIRONE HCL 5 MG PO TABS: ORAL_TABLET | ORAL | 0 refills | Status: DC

## 2017-03-27 ENCOUNTER — Encounter: Payer: Self-pay | Admitting: Internal Medicine

## 2017-03-27 ENCOUNTER — Ambulatory Visit (INDEPENDENT_AMBULATORY_CARE_PROVIDER_SITE_OTHER): Payer: PPO | Admitting: Internal Medicine

## 2017-03-27 VITALS — BP 132/72 | HR 69 | Ht 60.0 in | Wt 116.0 lb

## 2017-03-27 DIAGNOSIS — Z23 Encounter for immunization: Secondary | ICD-10-CM | POA: Diagnosis not present

## 2017-03-27 DIAGNOSIS — F419 Anxiety disorder, unspecified: Secondary | ICD-10-CM | POA: Diagnosis not present

## 2017-03-27 DIAGNOSIS — I10 Essential (primary) hypertension: Secondary | ICD-10-CM | POA: Diagnosis not present

## 2017-03-27 DIAGNOSIS — E785 Hyperlipidemia, unspecified: Secondary | ICD-10-CM

## 2017-03-27 MED ORDER — ZOSTER VAC RECOMB ADJUVANTED 50 MCG/0.5ML IM SUSR: 0.5000 mL | Freq: Once | INTRAMUSCULAR | 1 refills | Status: AC

## 2017-03-27 MED ORDER — TIZANIDINE HCL 4 MG PO TABS: 4.0000 mg | ORAL_TABLET | Freq: Three times a day (TID) | ORAL | 12 refills | Status: DC | PRN

## 2017-03-27 MED ORDER — LISINOPRIL 20 MG PO TABS: 20.0000 mg | ORAL_TABLET | Freq: Every day | ORAL | 5 refills | Status: DC

## 2017-03-27 MED ORDER — ALENDRONATE SODIUM 70 MG PO TABS: 70.0000 mg | ORAL_TABLET | ORAL | 12 refills | Status: DC

## 2017-03-27 MED ORDER — LOVASTATIN 20 MG PO TABS: 20.0000 mg | ORAL_TABLET | Freq: Every day | ORAL | 12 refills | Status: DC

## 2017-03-27 MED ORDER — BUSPIRONE HCL 5 MG PO TABS: ORAL_TABLET | ORAL | 12 refills | Status: DC

## 2017-03-27 MED ORDER — FUROSEMIDE 20 MG PO TABS: 20.0000 mg | ORAL_TABLET | Freq: Every day | ORAL | 12 refills | Status: DC

## 2017-03-27 NOTE — Progress Notes (Signed)
Date:  03/27/2017   Name:  Brittany Alvarez   DOB:  08-02-1945   MRN:  [id number]   Chief Complaint: Anxiety (Needs refill on Buspar.) Anxiety  Presents for follow-up visit. Patient reports no chest pain, compulsions, decreased concentration, depressed mood, dizziness, insomnia, nervous/anxious behavior, palpitations or shortness of breath. Symptoms occur occasionally. The severity of symptoms is mild. The quality of sleep is good. Nighttime awakenings: occasional.    Hypertension  This is a chronic problem. The problem is unchanged. Associated symptoms include anxiety. Pertinent negatives include no chest pain, headaches, palpitations, peripheral edema or shortness of breath. Risk factors for coronary artery disease include dyslipidemia.  Hyperlipidemia  This is a chronic problem. The problem is controlled. Pertinent negatives include no chest pain or shortness of breath. Current antihyperlipidemic treatment includes statins. The current treatment provides significant improvement of lipids.      Review of Systems  Constitutional: Negative for chills, fatigue, fever and unexpected weight change.  Respiratory: Negative for chest tightness, shortness of breath and wheezing.   Cardiovascular: Negative for chest pain, palpitations and leg swelling.  Gastrointestinal: Negative for abdominal pain.  Endocrine: Negative for polydipsia and polyuria.  Neurological: Negative for dizziness, numbness and headaches.  Psychiatric/Behavioral: Negative for decreased concentration. The patient is not nervous/anxious and does not have insomnia.     Patient Active Problem List   Diagnosis Date Noted  . Deaf 04/13/2015  . Anxiety 03/04/2015  . Benign colonic polyp 03/04/2015  . Degeneration of intervertebral disc of lumbar region 03/04/2015  . Dyslipidemia 03/04/2015  . Allergic state 03/04/2015  . Essential (primary) hypertension 03/04/2015  . Calcium blood increased 03/04/2015  . Arthritis of  hand, degenerative 03/04/2015  . Osteoporosis without current pathological fracture 03/04/2015  . Vestibular schwannoma ([place]) 03/04/2015    Prior to Admission medications   Medication Sig Start Date End Date Taking? Authorizing Provider  alendronate (FOSAMAX) 70 MG tablet TAKE 1 TABLET BY MOUTH EVERY 7 DAYS. TAKE WITH A FULL GLASS OF WATER ON AN EMPTY STOMACH. TAKE ON SUNDAYS 06/30/16  Yes Glean Hess, MD  aspirin 81 MG tablet Take 81 mg by mouth daily.   Yes [provider]  busPIRone (BUSPAR) 5 MG tablet TAKE ONE TABLET BY MOUTH 3 TIMES DAILY FOR ANXIETY 03/25/17  Yes Glean Hess, MD  furosemide (LASIX) 20 MG tablet TAKE ONE (1) TABLET BY MOUTH EVERY DAY 11/10/16  Yes Glean Hess, MD  lisinopril (PRINIVIL,ZESTRIL) 20 MG tablet Take 1 tablet (20 mg total) by mouth daily. 02/10/17  Yes Glean Hess, MD  lovastatin (MEVACOR) 20 MG tablet TAKE TWO TABLETS BY MOUTH EVERY NIGHT AT BEDTIME 03/24/16  Yes Glean Hess, MD  tiZANidine (ZANAFLEX) 4 MG tablet Take 1 tablet (4 mg total) by mouth every 8 (eight) hours as needed for muscle spasms. 04/15/16  Yes Glean Hess, MD  vitamin B-12 (CYANOCOBALAMIN) 100 MCG tablet Take 100 mcg by mouth daily.   Yes [provider]    Allergies  Allergen Reactions  . Povidone Iodine Rash  . Latex Hives    Patient states painful and rash  . Thiazide-Type Diuretics     Hypercalcemia    Past Surgical History:  Procedure Laterality Date  . ANTERIOR LAT LUMBAR FUSION  06/24/2011   Procedure: ANTERIOR LATERAL LUMBAR FUSION 1 LEVEL;  Surgeon: Faythe Ghee, MD;  Location: [place] [facility];  Service: Neurosurgery;  Laterality: Right;  Right Lumbar Three-Four Anterior Lateral Lumbar Interbody Fusion  with Right Lumbar Three-Four percutaneous pedicle screws (Lateral Postion for both procedures)  . APPENDECTOMY    . BACK SURGERY     x 2  . BREAST CYST ASPIRATION Left 1990   NEG  . BREAST EXCISIONAL BIOPSY Left age 9's    NEG  . CHOLECYSTECTOMY    . COLONOSCOPY WITH PROPOFOL N/A 01/15/2016   Procedure: COLONOSCOPY WITH PROPOFOL;  Surgeon: Lollie Sails, MD;  Location: [facility];  Service: Endoscopy;  Laterality: N/A;  . OVARY SURGERY     removal of left and left tube  . TONSILLECTOMY    . vestibular tumor resection Right 2016   schwannoma    Social History  Substance Use Topics  . Smoking status: Never Smoker  . Smokeless tobacco: Never Used  . Alcohol use 0.0 oz/week     Comment: occassional- wine once a month     Medication list has been reviewed and updated.  PHQ 2/9 Scores 01/05/2017 04/15/2016 04/13/2015  PHQ - 2 Score 0 0 0    Physical Exam  Constitutional: She is oriented to person, place, and time. She appears well-developed. No distress.  HENT:  Head: Normocephalic and atraumatic.  Eyes: Pupils are equal, round, and reactive to light.  Neck: Normal range of motion. Neck supple. Carotid bruit is not present. No thyromegaly present.  Cardiovascular: Normal rate, regular rhythm and normal heart sounds.   Pulmonary/Chest: Effort normal and breath sounds normal. No respiratory distress. She has no wheezes.  Musculoskeletal: She exhibits no edema or tenderness.  Neurological: She is alert and oriented to person, place, and time. She has normal strength and normal reflexes. Gait normal.  Skin: Skin is warm, dry and intact. No rash noted.  Psychiatric: She has a normal mood and affect. Her speech is normal and behavior is normal. Thought content normal.  Nursing note and vitals reviewed.   BP 132/72   Pulse 69   Ht 5' (1.524 m)   Wt 116 lb (52.6 kg)   SpO2 98%   BMI 22.65 kg/m   Assessment and Plan: 1. Essential (primary) hypertension controlled - lisinopril (PRINIVIL,ZESTRIL) 20 MG tablet; Take 1 tablet (20 mg total) by mouth daily.  Dispense: 30 tablet; Refill: 5 - furosemide (LASIX) 20 MG tablet; Take 1 tablet (20 mg total) by mouth daily.  Dispense: 30 tablet; Refill:  12 - CBC with Differential/Platelet - Comprehensive metabolic panel  2. Anxiety Doing well on medication - busPIRone (BUSPAR) 5 MG tablet; TAKE ONE TABLET BY MOUTH 3 TIMES DAILY FOR ANXIETY  Dispense: 90 tablet; Refill: 12 - TSH  3. Dyslipidemia On statin therapy - lovastatin (MEVACOR) 20 MG tablet; Take 1 tablet (20 mg total) by mouth at bedtime.  Dispense: 30 tablet; Refill: 12 - Lipid panel  4. Need for shingles vaccine - Zoster Vaccine Adjuvanted [facility]) injection; Inject 0.5 mLs into the muscle once.  Dispense: 0.5 mL; Refill: 1   Meds ordered this encounter  Medications  . tiZANidine (ZANAFLEX) 4 MG tablet    Sig: Take 1 tablet (4 mg total) by mouth every 8 (eight) hours as needed for muscle spasms.    Dispense:  90 tablet    Refill:  12  . lovastatin (MEVACOR) 20 MG tablet    Sig: Take 1 tablet (20 mg total) by mouth at bedtime.    Dispense:  30 tablet    Refill:  12  . lisinopril (PRINIVIL,ZESTRIL) 20 MG tablet    Sig: Take 1 tablet (20 mg total) by mouth  daily.    Dispense:  30 tablet    Refill:  5  . furosemide (LASIX) 20 MG tablet    Sig: Take 1 tablet (20 mg total) by mouth daily.    Dispense:  30 tablet    Refill:  12  . busPIRone (BUSPAR) 5 MG tablet    Sig: TAKE ONE TABLET BY MOUTH 3 TIMES DAILY FOR ANXIETY    Dispense:  90 tablet    Refill:  12  . alendronate (FOSAMAX) 70 MG tablet    Sig: Take 1 tablet (70 mg total) by mouth once a week. Take with a full glass of water on an empty stomach.    Dispense:  4 tablet    Refill:  12  . Zoster Vaccine Adjuvanted [facility]) injection    Sig: Inject 0.5 mLs into the muscle once.    Dispense:  0.5 mL    Refill:  1    Partially dictated using Editor, commissioning. Any errors are unintentional.  Halina Maidens, MD [facility]  03/27/2017

## 2017-03-30 DIAGNOSIS — F419 Anxiety disorder, unspecified: Secondary | ICD-10-CM | POA: Diagnosis not present

## 2017-03-30 DIAGNOSIS — E785 Hyperlipidemia, unspecified: Secondary | ICD-10-CM | POA: Diagnosis not present

## 2017-03-30 DIAGNOSIS — I10 Essential (primary) hypertension: Secondary | ICD-10-CM | POA: Diagnosis not present

## 2017-03-31 LAB — COMPREHENSIVE METABOLIC PANEL
A/G RATIO: 2.1 (ref 1.2–2.2)
ALK PHOS: 71 IU/L (ref 39–117)
ALT: 7 IU/L (ref 0–32)
AST: 19 IU/L (ref 0–40)
Albumin: 4.7 g/dL (ref 3.6–4.8)
BILIRUBIN TOTAL: 0.5 mg/dL (ref 0.0–1.2)
BUN / CREAT RATIO: 19 (ref 12–28)
BUN: 19 mg/dL (ref 8–27)
CHLORIDE: 100 mmol/L (ref 96–106)
CO2: 26 mmol/L (ref 20–29)
Calcium: 9.9 mg/dL (ref 8.7–10.3)
Creatinine, Ser: 1.02 mg/dL — ABNORMAL HIGH (ref 0.57–1.00)
GFR calc non Af Amer: 56 mL/min/{1.73_m2} — ABNORMAL LOW (ref >59)
GFR, EST AFRICAN AMERICAN: 65 mL/min/{1.73_m2} (ref >59)
GLUCOSE: 95 mg/dL (ref 65–99)
Globulin, Total: 2.2 g/dL (ref 1.5–4.5)
POTASSIUM: 4.8 mmol/L (ref 3.5–5.2)
Sodium: 145 mmol/L — ABNORMAL HIGH (ref 134–144)
TOTAL PROTEIN: 6.9 g/dL (ref 6.0–8.5)

## 2017-03-31 LAB — CBC WITH DIFFERENTIAL/PLATELET
BASOS ABS: 0.1 10*3/uL (ref 0.0–0.2)
BASOS: 1 %
EOS (ABSOLUTE): 0.1 10*3/uL (ref 0.0–0.4)
Eos: 2 %
Hematocrit: 40.1 % (ref 34.0–46.6)
Hemoglobin: 13.1 g/dL (ref 11.1–15.9)
Immature Grans (Abs): 0 10*3/uL (ref 0.0–0.1)
Immature Granulocytes: 0 %
LYMPHS ABS: 2.2 10*3/uL (ref 0.7–3.1)
Lymphs: 37 %
MCH: 27.5 pg (ref 26.6–33.0)
MCHC: 32.7 g/dL (ref 31.5–35.7)
MCV: 84 fL (ref 79–97)
MONOS ABS: 0.5 10*3/uL (ref 0.1–0.9)
Monocytes: 8 %
NEUTROS ABS: 3.1 10*3/uL (ref 1.4–7.0)
Neutrophils: 52 %
PLATELETS: 367 10*3/uL (ref 150–379)
RBC: 4.77 x10E6/uL (ref 3.77–5.28)
RDW: 13.8 % (ref 12.3–15.4)
WBC: 6 10*3/uL (ref 3.4–10.8)

## 2017-03-31 LAB — LIPID PANEL
CHOLESTEROL TOTAL: 210 mg/dL — AB (ref 100–199)
Chol/HDL Ratio: 2.9 ratio (ref 0.0–4.4)
HDL: 73 mg/dL (ref >39)
LDL Calculated: 119 mg/dL — ABNORMAL HIGH (ref 0–99)
Triglycerides: 89 mg/dL (ref 0–149)
VLDL CHOLESTEROL CAL: 18 mg/dL (ref 5–40)

## 2017-03-31 LAB — TSH: TSH: 2.94 u[IU]/mL (ref 0.450–4.500)

## 2017-06-05 ENCOUNTER — Other Ambulatory Visit: Payer: Self-pay | Admitting: Internal Medicine

## 2017-06-05 DIAGNOSIS — Z1231 Encounter for screening mammogram for malignant neoplasm of breast: Secondary | ICD-10-CM

## 2017-06-24 ENCOUNTER — Ambulatory Visit
Admission: RE | Admit: 2017-06-24 | Discharge: 2017-06-24 | Disposition: A | Discharge status: Home / Self Care | Payer: PPO | Source: Ambulatory Visit | Attending: Internal Medicine | Admitting: Internal Medicine

## 2017-06-24 DIAGNOSIS — Z1231 Encounter for screening mammogram for malignant neoplasm of breast: Secondary | ICD-10-CM | POA: Insufficient documentation

## 2017-08-31 ENCOUNTER — Telehealth: Payer: Self-pay

## 2017-08-31 NOTE — Telephone Encounter (Signed)
01/07/18 AWV needing rescheduled. Program no longer available on Thurs. Last AWV 01/05/17. LVM requesting returned call.

## 2017-09-02 NOTE — Telephone Encounter (Signed)
Called to reschedule 01/07/18 AWV Program no longer available on Thursdays. LVM requesting returned call.

## 2017-09-28 ENCOUNTER — Encounter: Payer: Self-pay | Admitting: Internal Medicine

## 2017-09-28 ENCOUNTER — Ambulatory Visit (INDEPENDENT_AMBULATORY_CARE_PROVIDER_SITE_OTHER): Payer: PPO | Admitting: Internal Medicine

## 2017-09-28 VITALS — BP 142/84 | HR 83 | Resp 16 | Ht 60.0 in | Wt 118.8 lb

## 2017-09-28 DIAGNOSIS — A770 Spotted fever due to Rickettsia rickettsii: Secondary | ICD-10-CM | POA: Diagnosis not present

## 2017-09-28 MED ORDER — DOXYCYCLINE HYCLATE 100 MG PO TABS: 100.0000 mg | ORAL_TABLET | Freq: Two times a day (BID) | ORAL | 0 refills | Status: AC

## 2017-09-28 NOTE — Progress Notes (Signed)
Date:  09/28/2017   Name:  Brittany Alvarez   DOB:  [birth year]   MRN:  [id number]   Chief Complaint: Insect Bite (pulled tick off today and knows it was there for over 1 week. Noticed rash this morning after being hot overnight and had rash already on arms for week or two. )  Tick likely attached for a week - scratched off this AM.  Rash started about a week ago - thought it was poison ivy.  Itchy with headache and fatigue.  Now rash is spreading over chest and abdomen.  No eye pain, n/v/d or bleeding issues.   Review of Systems  Constitutional: Positive for fatigue. Negative for chills and fever.  Eyes: Negative for photophobia and redness.  Respiratory: Negative for cough, chest tightness and wheezing.   Cardiovascular: Negative for chest pain and palpitations.  Gastrointestinal: Negative for abdominal pain, diarrhea, nausea and vomiting.  Musculoskeletal: Negative for arthralgias and back pain.  Skin: Positive for rash.  Neurological: Positive for headaches.  Hematological: Negative for adenopathy.  Psychiatric/Behavioral: Negative for dysphoric mood and sleep disturbance.    Patient Active Problem List   Diagnosis Date Noted  . Deaf 04/13/2015  . Anxiety 03/04/2015  . Benign colonic polyp 03/04/2015  . Degeneration of intervertebral disc of lumbar region 03/04/2015  . Dyslipidemia 03/04/2015  . Allergic state 03/04/2015  . Essential (primary) hypertension 03/04/2015  . Calcium blood increased 03/04/2015  . Arthritis of hand, degenerative 03/04/2015  . Osteoporosis without current pathological fracture 03/04/2015  . History of benign schwannoma 03/04/2015    Prior to Admission medications   Medication Sig Start Date End Date Taking? Authorizing Provider  alendronate (FOSAMAX) 70 MG tablet Take 1 tablet (70 mg total) by mouth once a week. Take with a full glass of water on an empty stomach. 03/27/17  Yes Glean Hess, MD  aspirin 81 MG tablet Take 81 mg by mouth daily.    Yes [provider]  busPIRone (BUSPAR) 5 MG tablet TAKE ONE TABLET BY MOUTH 3 TIMES DAILY FOR ANXIETY 03/27/17  Yes Glean Hess, MD  furosemide (LASIX) 20 MG tablet Take 1 tablet (20 mg total) by mouth daily. 03/27/17  Yes Glean Hess, MD  lisinopril (PRINIVIL,ZESTRIL) 20 MG tablet Take 1 tablet (20 mg total) by mouth daily. 03/27/17  Yes Glean Hess, MD  lovastatin (MEVACOR) 20 MG tablet Take 1 tablet (20 mg total) by mouth at bedtime. 03/27/17  Yes Glean Hess, MD  tiZANidine (ZANAFLEX) 4 MG tablet Take 1 tablet (4 mg total) by mouth every 8 (eight) hours as needed for muscle spasms. 03/27/17  Yes Glean Hess, MD  vitamin B-12 (CYANOCOBALAMIN) 100 MCG tablet Take 100 mcg by mouth daily.   Yes [provider]    Allergies  Allergen Reactions  . Povidone Iodine Rash  . Latex Hives    Patient states painful and rash  . Thiazide-Type Diuretics     Hypercalcemia    Past Surgical History:  Procedure Laterality Date  . ANTERIOR LAT LUMBAR FUSION  06/24/2011   Procedure: ANTERIOR LATERAL LUMBAR FUSION 1 LEVEL;  Surgeon: Faythe Ghee, MD;  Location: [place] NEURO ORS;  Service: Neurosurgery;  Laterality: Right;  Right Lumbar Three-Four Anterior Lateral Lumbar Interbody Fusion with Right Lumbar Three-Four percutaneous pedicle screws (Lateral Postion for both procedures)  . APPENDECTOMY    . BACK SURGERY     x 2  . BREAST CYST ASPIRATION Left 1990  NEG  . BREAST EXCISIONAL BIOPSY Left age 89's   NEG  . CHOLECYSTECTOMY    . COLONOSCOPY WITH PROPOFOL N/A 01/15/2016   Procedure: COLONOSCOPY WITH PROPOFOL;  Surgeon: Lollie Sails, MD;  Location: [facility] END[facility]COPY;  Service: Endoscopy;  Laterality: N/A;  . OVARY SURGERY     removal of left and left tube  . TONSILLECTOMY    . vestibular tumor resection Right 2016   schwannoma    Social History   Tobacco Use  . Smoking status: Never Smoker  . Smokeless tobacco: Never Used  Substance Use Topics  .  Alcohol use: Yes    Alcohol/week: 0.0 oz    Comment: occassional- wine once a month  . Drug use: No     Medication list has been reviewed and updated.  PHQ 2/9 Scores 01/05/2017 04/15/2016 04/13/2015  PHQ - 2 Score 0 0 0    Physical Exam  Constitutional: She is oriented to person, place, and time. She appears well-developed. No distress.  HENT:  Head: Normocephalic and atraumatic.  Cardiovascular: Normal rate, regular rhythm and normal heart sounds.  Pulmonary/Chest: Effort normal and breath sounds normal. No respiratory distress.  Musculoskeletal: Normal range of motion.  Neurological: She is alert and oriented to person, place, and time.  Skin: Skin is warm and dry. Rash noted. Rash is macular.  Diffuse, fine red macular rash over arms, and abdomen.  No vesicles.  Site of tick bite on lateral right knee - slightly red without target lesion.  Psychiatric: She has a normal mood and affect. Her behavior is normal. Thought content normal.    BP (!) 142/84   Pulse 83   Resp 16   Ht 5' (1.524 m)   Wt 118 lb 12.8 oz (53.9 kg)   SpO2 100%   BMI 23.20 kg/m   Assessment and Plan: 1. [facility] spotted fever)(suspected) Benadryl for itching Tylenol for headache Fluids and rest - doxycycline (VIBRA-TABS) 100 MG tablet; Take 1 tablet (100 mg total) by mouth 2 (two) times daily for 10 days.  Dispense: 20 tablet; Refill: 0   Meds ordered this encounter  Medications  . doxycycline (VIBRA-TABS) 100 MG tablet    Sig: Take 1 tablet (100 mg total) by mouth 2 (two) times daily for 10 days.    Dispense:  20 tablet    Refill:  0    Partially dictated using Editor, commissioning. Any errors are unintentional.  Halina Maidens, MD [facility]  09/28/2017

## 2017-11-30 ENCOUNTER — Encounter: Payer: Self-pay | Admitting: Internal Medicine

## 2017-11-30 ENCOUNTER — Ambulatory Visit (INDEPENDENT_AMBULATORY_CARE_PROVIDER_SITE_OTHER): Payer: PPO | Admitting: Internal Medicine

## 2017-11-30 VITALS — BP 138/88 | HR 87 | Temp 97.6°F | Resp 16 | Ht 60.0 in | Wt 116.0 lb

## 2017-11-30 DIAGNOSIS — B356 Tinea cruris: Secondary | ICD-10-CM | POA: Diagnosis not present

## 2017-11-30 LAB — POCT WET PREP WITH KOH
CLUE CELLS WET PREP PER HPF POC: NEGATIVE
KOH Prep POC: NEGATIVE
Trichomonas, UA: NEGATIVE

## 2017-11-30 MED ORDER — FLUCONAZOLE 100 MG PO TABS: 100.0000 mg | ORAL_TABLET | Freq: Every day | ORAL | 0 refills | Status: DC

## 2017-11-30 NOTE — Progress Notes (Signed)
Date:  11/30/2017   Name:  Brittany Alvarez   DOB:  1945/07/01   MRN:  [id number]   Chief Complaint: Rash (Vaginal---burning, redness, itches--less than 1 wk--tried OTC meds cream but not helping.)    Review of Systems  Constitutional: Negative for chills, fatigue and fever.  Respiratory: Negative for cough, chest tightness, shortness of breath and wheezing.   Cardiovascular: Negative for chest pain and palpitations.  Genitourinary: Positive for dysuria and genital sores. Negative for vaginal bleeding and vaginal discharge.    Patient Active Problem List   Diagnosis Date Noted  . Deaf 04/13/2015  . Anxiety 03/04/2015  . Benign colonic polyp 03/04/2015  . Degeneration of intervertebral disc of lumbar region 03/04/2015  . Dyslipidemia 03/04/2015  . Allergic state 03/04/2015  . Essential (primary) hypertension 03/04/2015  . Calcium blood increased 03/04/2015  . Arthritis of hand, degenerative 03/04/2015  . Osteoporosis without current pathological fracture 03/04/2015  . History of benign schwannoma 03/04/2015    Prior to Admission medications   Medication Sig Start Date End Date Taking? Authorizing Provider  alendronate (FOSAMAX) 70 MG tablet Take 1 tablet (70 mg total) by mouth once a week. Take with a full glass of water on an empty stomach. 03/27/17   Glean Hess, MD  aspirin 81 MG tablet Take 81 mg by mouth daily.    [provider]  busPIRone (BUSPAR) 5 MG tablet TAKE ONE TABLET BY MOUTH 3 TIMES DAILY FOR ANXIETY 03/27/17   Glean Hess, MD  furosemide (LASIX) 20 MG tablet Take 1 tablet (20 mg total) by mouth daily. 03/27/17   Glean Hess, MD  lisinopril (PRINIVIL,ZESTRIL) 20 MG tablet Take 1 tablet (20 mg total) by mouth daily. 03/27/17   Glean Hess, MD  lovastatin (MEVACOR) 20 MG tablet Take 1 tablet (20 mg total) by mouth at bedtime. 03/27/17   Glean Hess, MD  tiZANidine (ZANAFLEX) 4 MG tablet Take 1 tablet (4 mg total) by mouth every 8  (eight) hours as needed for muscle spasms. 03/27/17   Glean Hess, MD  vitamin B-12 (CYANOCOBALAMIN) 100 MCG tablet Take 100 mcg by mouth daily.    [provider]    Allergies  Allergen Reactions  . Povidone Iodine Rash  . Latex Hives    Patient states painful and rash  . Thiazide-Type Diuretics     Hypercalcemia    Past Surgical History:  Procedure Laterality Date  . ANTERIOR LAT LUMBAR FUSION  06/24/2011   Procedure: ANTERIOR LATERAL LUMBAR FUSION 1 LEVEL;  Surgeon: Faythe Ghee, MD;  Location: [place] NEURO ORS;  Service: Neurosurgery;  Laterality: Right;  Right Lumbar Three-Four Anterior Lateral Lumbar Interbody Fusion with Right Lumbar Three-Four percutaneous pedicle screws (Lateral Postion for both procedures)  . APPENDECTOMY    . BACK SURGERY     x 2  . BREAST CYST ASPIRATION Left 1990   NEG  . BREAST EXCISIONAL BIOPSY Left age 23's   NEG  . CHOLECYSTECTOMY    . COLONOSCOPY WITH PROPOFOL N/A 01/15/2016   Procedure: COLONOSCOPY WITH PROPOFOL;  Surgeon: Lollie Sails, MD;  Location: [facility] END[facility]COPY;  Service: Endoscopy;  Laterality: N/A;  . OVARY SURGERY     removal of left and left tube  . TONSILLECTOMY    . vestibular tumor resection Right 2016   schwannoma    Social History   Tobacco Use  . Smoking status: Never Smoker  . Smokeless tobacco: Never Used  Substance Use Topics  .  Alcohol use: Yes    Alcohol/week: 0.0 oz    Comment: occassional- wine once a month  . Drug use: No     Medication list has been reviewed and updated.  Current Meds  Medication Sig  . alendronate (FOSAMAX) 70 MG tablet Take 1 tablet (70 mg total) by mouth once a week. Take with a full glass of water on an empty stomach.  Marland Kitchen aspirin 81 MG tablet Take 81 mg by mouth daily.  . busPIRone (BUSPAR) 5 MG tablet TAKE ONE TABLET BY MOUTH 3 TIMES DAILY FOR ANXIETY  . furosemide (LASIX) 20 MG tablet Take 1 tablet (20 mg total) by mouth daily.  Marland Kitchen lisinopril (PRINIVIL,ZESTRIL) 20 MG  tablet Take 1 tablet (20 mg total) by mouth daily.  Marland Kitchen lovastatin (MEVACOR) 20 MG tablet Take 1 tablet (20 mg total) by mouth at bedtime.  . Probiotic Product (PROBIOTIC ADVANCED PO) Take by mouth.  Marland Kitchen tiZANidine (ZANAFLEX) 4 MG tablet Take 1 tablet (4 mg total) by mouth every 8 (eight) hours as needed for muscle spasms.  . vitamin B-12 (CYANOCOBALAMIN) 100 MCG tablet Take 100 mcg by mouth daily.    PHQ 2/9 Scores 01/05/2017 04/15/2016 04/13/2015  PHQ - 2 Score 0 0 0    Physical Exam  Constitutional: She is oriented to person, place, and time. She appears well-developed. No distress.  HENT:  Head: Normocephalic and atraumatic.  Pulmonary/Chest: Effort normal. No respiratory distress.  Genitourinary:  Genitourinary Comments: Minimal clear vaginal discharge noted  Musculoskeletal: Normal range of motion.  Neurological: She is alert and oriented to person, place, and time.  Skin: Skin is warm and dry. No rash noted.  Red rash with satellite lesions mainly in right groin with a few lesions on both labia majora.   Psychiatric: She has a normal mood and affect. Her behavior is normal. Thought content normal.  Nursing note and vitals reviewed.   BP 138/88   Pulse 87   Temp 97.6 F (36.4 C)   Resp 16   Ht 5' (1.524 m)   Wt 116 lb (52.6 kg)   SpO2 98%   BMI 22.65 kg/m   Assessment and Plan: 1. Tinea cruris Continue diaper rash ointment for comfort - fluconazole (DIFLUCAN) 100 MG tablet; Take 1 tablet (100 mg total) by mouth daily.  Dispense: 7 tablet; Refill: 0 - POCT Wet Prep with KOH   Meds ordered this encounter  Medications  . fluconazole (DIFLUCAN) 100 MG tablet    Sig: Take 1 tablet (100 mg total) by mouth daily.    Dispense:  7 tablet    Refill:  0    Partially dictated using Editor, commissioning. Any errors are unintentional.  Halina Maidens, MD [facility]  11/30/2017

## 2017-12-07 ENCOUNTER — Telehealth: Payer: Self-pay

## 2017-12-07 NOTE — Telephone Encounter (Signed)
Patient called requesting a letter to excuse her from [facility] for not being able to hear in one ear due to no ear drum.  Spoke with Dr Army Melia and called patient back to inform her unfortunately we cannot excuse her from [facility] for not being able to hear out of one ear. Since, she can hear out of the other ear we cannot type a letter to excuse her.  She verbalized understanding.

## 2017-12-13 DIAGNOSIS — R21 Rash and other nonspecific skin eruption: Secondary | ICD-10-CM | POA: Diagnosis not present

## 2017-12-14 ENCOUNTER — Other Ambulatory Visit: Payer: Self-pay

## 2017-12-14 NOTE — Patient Outreach (Signed)
[facility]) Care Management  12/14/2017  Brittany Alvarez [birth year] [id number]   Nurse Call Line Referral Date: 12/14/17 Reason for Referral:  Leg rash Nurse call line recommendation: follow up with primary MD within 3 days.  Attempt #1  Telephone call to patient regarding nurse call line referral. Unable to reach patient. HIPAA compliant voice message left with call back phone number.   PLAN: RNCM will attempt 2nd telephone call to patient within 4 business days.  RNCM will send patient outreach letter.  Quinn Plowman RN,BSN,CCM [facility] Telephonic  [phone]

## 2017-12-15 ENCOUNTER — Other Ambulatory Visit: Payer: BC Managed Care – PPO

## 2017-12-15 ENCOUNTER — Other Ambulatory Visit: Payer: Self-pay

## 2017-12-15 ENCOUNTER — Ambulatory Visit (INDEPENDENT_AMBULATORY_CARE_PROVIDER_SITE_OTHER): Payer: PPO | Admitting: Internal Medicine

## 2017-12-15 ENCOUNTER — Encounter: Payer: Self-pay | Admitting: Internal Medicine

## 2017-12-15 VITALS — BP 108/68 | HR 76 | Ht 60.0 in | Wt 116.0 lb

## 2017-12-15 DIAGNOSIS — R21 Rash and other nonspecific skin eruption: Secondary | ICD-10-CM

## 2017-12-15 MED ORDER — PREDNISONE 10 MG PO TABS: ORAL_TABLET | ORAL | 0 refills | Status: DC

## 2017-12-15 NOTE — Patient Outreach (Signed)
[facility]) Care Management  12/15/2017  Brittany Alvarez September 23, 1945 [id number]   TELEPHONE SCREENING Referral date: 12/15/17 Referral source: nurse call line Referral reason: leg rash  Telephone call to patient regarding nurse call line referral. HIPAA verified with patient. Explained reason for call.  Patient states the rash is on both legs now.  Patient state she has seen two doctors since talking with the nurse call line.  Patient states she saw a doctor who started her on prednisone.  Patient states she recently saw her primary MD who has started her on another serious of prednisone.  Patient states she is to call her primary MD back on 12/21/17 or before if rash does not improve or worsens.  Patient states her doctor said if the rash does not improve she will refer her to a dermatologist.   Patient denies any further needs at this time. RNCM advised patient to contact the nurse advised line if needed.   PLAN: RNCM will close patient due to patient being assessed and having no further needs.  RNCm will send closure notification to patients primary MD.   Quinn Plowman RN,BSN,CCM [facility] Telephonic  [phone]

## 2017-12-15 NOTE — Progress Notes (Signed)
Date:  12/15/2017   Name:  Brittany Alvarez   DOB:  1945/07/12   MRN:  [id number]   Chief Complaint: Rash (Started 1 week ago. Seen UC in [place]. Was given 2 steroid shots and doxycycline and hyrdocortisone cream, and benadryl.  Its itching, and uncomfortable. Looks worse than it did today. )  Rash  This is a new problem. The current episode started in the past 7 days. The problem has been gradually worsening since onset. The affected locations include the left lower leg and right lower leg. The rash is characterized by redness and itchiness. She was exposed to nothing (did take fluconazole one week prior to onset). Pertinent negatives include no cough, fatigue, fever or shortness of breath. Past treatments include topical steroids, antibiotics and anti-itch cream. The treatment provided mild relief.     Review of Systems  Constitutional: Negative for chills, fatigue and fever.  Respiratory: Negative for cough, chest tightness and shortness of breath.   Cardiovascular: Negative for chest pain and palpitations.  Skin: Positive for rash. Negative for color change.  Hematological: Negative for adenopathy.  Psychiatric/Behavioral: Negative for dysphoric mood and sleep disturbance. The patient is not nervous/anxious.     Patient Active Problem List   Diagnosis Date Noted  . Deaf 04/13/2015  . Anxiety 03/04/2015  . Benign colonic polyp 03/04/2015  . Degeneration of intervertebral disc of lumbar region 03/04/2015  . Dyslipidemia 03/04/2015  . Allergic state 03/04/2015  . Essential (primary) hypertension 03/04/2015  . Calcium blood increased 03/04/2015  . Arthritis of hand, degenerative 03/04/2015  . Osteoporosis without current pathological fracture 03/04/2015  . History of benign schwannoma 03/04/2015    Prior to Admission medications   Medication Sig Start Date End Date Taking? Authorizing Provider  alendronate (FOSAMAX) 70 MG tablet Take 1 tablet (70 mg total) by mouth once a  week. Take with a full glass of water on an empty stomach. 03/27/17  Yes Brittany Hess, MD  aspirin 81 MG tablet Take 81 mg by mouth daily.   Yes [provider]  busPIRone (BUSPAR) 5 MG tablet TAKE ONE TABLET BY MOUTH 3 TIMES DAILY FOR ANXIETY 03/27/17  Yes Brittany Hess, MD  diphenhydrAMINE (BENYLIN) 12.5 MG/5ML syrup Take by mouth 4 (four) times daily as needed for allergies.   Yes [provider]  furosemide (LASIX) 20 MG tablet Take 1 tablet (20 mg total) by mouth daily. 03/27/17  Yes Brittany Hess, MD  hydrocortisone 2.5 % cream Apply topically 2 (two) times daily.   Yes [provider]  lisinopril (PRINIVIL,ZESTRIL) 20 MG tablet Take 1 tablet (20 mg total) by mouth daily. 03/27/17  Yes Brittany Hess, MD  lovastatin (MEVACOR) 20 MG tablet Take 1 tablet (20 mg total) by mouth at bedtime. 03/27/17  Yes Brittany Hess, MD  Probiotic Product (PROBIOTIC ADVANCED PO) Take by mouth.   Yes [provider]  tiZANidine (ZANAFLEX) 4 MG tablet Take 1 tablet (4 mg total) by mouth every 8 (eight) hours as needed for muscle spasms. 03/27/17  Yes Brittany Hess, MD  vitamin B-12 (CYANOCOBALAMIN) 100 MCG tablet Take 100 mcg by mouth daily.   Yes [provider]  fluconazole (DIFLUCAN) 100 MG tablet Take 1 tablet (100 mg total) by mouth daily. Patient not taking: Reported on 12/15/2017 11/30/17   Brittany Hess, MD    Allergies  Allergen Reactions  . Povidone Iodine Rash  . Latex Hives    Patient states painful  and rash  . Thiazide-Type Diuretics     Hypercalcemia    Past Surgical History:  Procedure Laterality Date  . ANTERIOR LAT LUMBAR FUSION  06/24/2011   Procedure: ANTERIOR LATERAL LUMBAR FUSION 1 LEVEL;  Surgeon: Faythe Ghee, MD;  Location: [place] NEURO ORS;  Service: Neurosurgery;  Laterality: Right;  Right Lumbar Three-Four Anterior Lateral Lumbar Interbody Fusion with Right Lumbar Three-Four percutaneous pedicle screws (Lateral  Postion for both procedures)  . APPENDECTOMY    . BACK SURGERY     x 2  . BREAST CYST ASPIRATION Left 1990   NEG  . BREAST EXCISIONAL BIOPSY Left age 18's   NEG  . CHOLECYSTECTOMY    . COLONOSCOPY WITH PROPOFOL N/A 01/15/2016   Procedure: COLONOSCOPY WITH PROPOFOL;  Surgeon: Lollie Sails, MD;  [facility] ENDOSCOPY;  Service: Endoscopy;  Laterality: N/A;  . OVARY SURGERY     removal of left and left tube  . TONSILLECTOMY    . vestibular tumor resection Right 2016   schwannoma    Social History   Tobacco Use  . Smoking status: Never Smoker  . Smokeless tobacco: Never Used  Substance Use Topics  . Alcohol use: Yes    Alcohol/week: 0.0 oz    Comment: occassional- wine once a month  . Drug use: No     Medication list has been reviewed and updated.  Current Meds  Medication Sig  . alendronate (FOSAMAX) 70 MG tablet Take 1 tablet (70 mg total) by mouth once a week. Take with a full glass of water on an empty stomach.  Marland Kitchen aspirin 81 MG tablet Take 81 mg by mouth daily.  . busPIRone (BUSPAR) 5 MG tablet TAKE ONE TABLET BY MOUTH 3 TIMES DAILY FOR ANXIETY  . diphenhydrAMINE (BENYLIN) 12.5 MG/5ML syrup Take by mouth 4 (four) times daily as needed for allergies.  . furosemide (LASIX) 20 MG tablet Take 1 tablet (20 mg total) by mouth daily.  . hydrocortisone 2.5 % cream Apply topically 2 (two) times daily.  Marland Kitchen lisinopril (PRINIVIL,ZESTRIL) 20 MG tablet Take 1 tablet (20 mg total) by mouth daily.  Marland Kitchen lovastatin (MEVACOR) 20 MG tablet Take 1 tablet (20 mg total) by mouth at bedtime.  . Probiotic Product (PROBIOTIC ADVANCED PO) Take by mouth.  Marland Kitchen tiZANidine (ZANAFLEX) 4 MG tablet Take 1 tablet (4 mg total) by mouth every 8 (eight) hours as needed for muscle spasms.  . vitamin B-12 (CYANOCOBALAMIN) 100 MCG tablet Take 100 mcg by mouth daily.    PHQ 2/9 Scores 01/05/2017 04/15/2016 04/13/2015  PHQ - 2 Score 0 0 0    Physical Exam  Constitutional: She is oriented to person, place,  and time. She appears well-developed. No distress.  HENT:  Head: Normocephalic and atraumatic.  Pulmonary/Chest: Effort normal. No respiratory distress.  Musculoskeletal: Normal range of motion.  Neurological: She is alert and oriented to person, place, and time.  Skin: Skin is warm and dry. No rash noted.  Scattered tiny red non blanching papular lesions over both anterior shins  Psychiatric: She has a normal mood and affect. Her behavior is normal. Thought content normal.  Nursing note and vitals reviewed.   BP 108/68   Pulse 76   Ht 5' (1.524 m)   Wt 116 lb (52.6 kg)   SpO2 99%   BMI 22.65 kg/m   Assessment and Plan: 1. Rash and nonspecific skin eruption Possible reaction to diflucan May need Dermatology evaluation if persistent - predniSONE (DELTASONE) 10 MG tablet; Take  6 on day 1, 5 on day 2, 4 on day 3, 3 on day 4, 2 on day 5 and 1 on day 1 then stop.  Dispense: 21 tablet; Refill: 0   Meds ordered this encounter  Medications  . predniSONE (DELTASONE) 10 MG tablet    Sig: Take 6 on day 1, 5 on day 2, 4 on day 3, 3 on day 4, 2 on day 5 and 1 on day 1 then stop.    Dispense:  21 tablet    Refill:  0    Partially dictated using Editor, commissioning. Any errors are unintentional.  Halina Maidens, MD [facility]  12/15/2017   There are no diagnoses linked to this encounter.

## 2017-12-15 NOTE — Patient Outreach (Signed)
[place] [facility]) Care Management  12/15/2017  CRYSTALE GIANNATTASIO Jul 30, 1945 [id number]  Nurse Call Line Referral Date: 12/14/17 Reason for Referral:  Leg rash Nurse call line recommendation: follow up with primary MD within 3 days.  Attempt #2  Telephone call to patient regarding nurse call line referral. Unable to reach patient. HIPAA compliant voice message left with call back phone number.   PLAN: RNCM will attempt 3rd telephone call to patient within 4 business days.    Quinn Plowman RN,BSN,CCM [facility] Telephonic  [phone]

## 2017-12-15 NOTE — Patient Instructions (Signed)
Stop doxycycline.

## 2017-12-18 ENCOUNTER — Ambulatory Visit: Payer: Self-pay

## 2018-01-07 ENCOUNTER — Ambulatory Visit: Payer: PPO

## 2018-01-11 ENCOUNTER — Ambulatory Visit (INDEPENDENT_AMBULATORY_CARE_PROVIDER_SITE_OTHER): Payer: PPO

## 2018-01-11 VITALS — BP 138/70 | HR 82 | Temp 97.8°F | Resp 12 | Ht 60.0 in | Wt 117.4 lb

## 2018-01-11 DIAGNOSIS — Z0000 Encounter for general adult medical examination without abnormal findings: Secondary | ICD-10-CM

## 2018-01-11 NOTE — Progress Notes (Signed)
Subjective:   Brittany Alvarez is a 72 y.o. female who presents for Medicare Annual (Subsequent) preventive examination.  Review of Systems:  N/A Cardiac Risk Factors include: advanced age (>6men, >57 women);dyslipidemia;hypertension;sedentary lifestyle     Objective:     Vitals: BP 138/70 (BP Location: Right Arm, Patient Position: Sitting, Cuff Size: Normal)   Pulse 82   Temp 97.8 F (36.6 C) (Oral)   Resp 12   Ht 5' (1.524 m)   Wt 117 lb 6.4 oz (53.3 kg)   SpO2 99%   BMI 22.93 kg/m   Body mass index is 22.93 kg/m.  Advanced Directives 01/11/2018 01/05/2017 01/15/2016 06/23/2011  Does Patient Have a Medical Advance Directive? Yes Yes No Patient has advance directive, copy not in chart  Type of Advance Directive [place];Living will [place];Living will - [place];Living will  Copy of [place] in Chart? No - copy requested No - copy requested - -  Would patient like information on creating a medical advance directive? - - No - patient declined information -    Tobacco Social History   Tobacco Use  Smoking Status Never Smoker  Smokeless Tobacco Never Used  Tobacco Comment   smoking cessation materials not required     Counseling given: No Comment: smoking cessation materials not required  Clinical Intake:  Pre-visit preparation completed: Yes  Pain : No/denies pain Pain Score: 0-No pain   BMI - recorded: 22.93 Nutritional Status: BMI of 19-24  Normal Nutritional Risks: None Diabetes: No  How often do you need to have someone help you when you read instructions, pamphlets, or other written materials from your doctor or pharmacy?: 1 - Never  Interpreter Needed?: No  Information entered by :: AEversole, LPN  Past Medical History:  Diagnosis Date  . Anemia   . Anxiety   . Blood transfusion    approx. 40 years ago  . Gallstones    hx of  . GERD (gastroesophageal reflux disease)    . Hyperlipidemia   . Hypertension    sees Dr. Army Melia in [place], [place]   Past Surgical History:  Procedure Laterality Date  . ANTERIOR LAT LUMBAR FUSION  06/24/2011   Procedure: ANTERIOR LATERAL LUMBAR FUSION 1 LEVEL;  Surgeon: Faythe Ghee, MD;  Location: [facility];  Service: Neurosurgery;  Laterality: Right;  Right Lumbar Three-Four Anterior Lateral Lumbar Interbody Fusion with Right Lumbar Three-Four percutaneous pedicle screws (Lateral Postion for both procedures)  . APPENDECTOMY    . BACK SURGERY     x 2  . BREAST CYST ASPIRATION Left 1990   NEG  . BREAST EXCISIONAL BIOPSY Left age 27's   NEG  . CHOLECYSTECTOMY    . COLONOSCOPY WITH PROPOFOL N/A 01/15/2016   Procedure: COLONOSCOPY WITH PROPOFOL;  Surgeon: Lollie Sails, MD;  Location: [facility];  Service: Endoscopy;  Laterality: N/A;  . OVARY SURGERY     removal of left and left tube  . TONSILLECTOMY    . vestibular tumor resection Right 2016   schwannoma   Family History  Problem Relation Age of Onset  . COPD Father   . Breast cancer Mother 39  . Heart failure Mother    Social History   Socioeconomic History  . Marital status: Married    Spouse name: Not on file  . Number of children: 2  . Years of education: Not on file  . Highest education level: 12th grade  Occupational History  . Occupation: Retired  Scientific laboratory technician  . Financial resource strain: Not hard at all  . Food insecurity:    Worry: Never true    Inability: Never true  . Transportation needs:    Medical: No    Non-medical: No  Tobacco Use  . Smoking status: Never Smoker  . Smokeless tobacco: Never Used  . Tobacco comment: smoking cessation materials not required  Substance and Sexual Activity  . Alcohol use: Yes    Alcohol/week: 0.0 standard drinks    Comment: occ  . Drug use: No  . Sexual activity: Not Currently  Lifestyle  . Physical activity:    Days per week: 0 days    Minutes per session: 0 min  . Stress: Not  at all  Relationships  . Social connections:    Talks on phone: Patient refused    Gets together: Patient refused    Attends religious service: Patient refused    Active member of club or organization: Patient refused    Attends meetings of clubs or organizations: Patient refused    Relationship status: Married  Other Topics Concern  . Not on file  Social History Narrative  . Not on file    Outpatient Encounter Medications as of 01/11/2018  Medication Sig  . alendronate (FOSAMAX) 70 MG tablet Take 1 tablet (70 mg total) by mouth once a week. Take with a full glass of water on an empty stomach.  Marland Kitchen aspirin 81 MG tablet Take 81 mg by mouth daily.  . busPIRone (BUSPAR) 5 MG tablet TAKE ONE TABLET BY MOUTH 3 TIMES DAILY FOR ANXIETY  . furosemide (LASIX) 20 MG tablet Take 1 tablet (20 mg total) by mouth daily.  Marland Kitchen lisinopril (PRINIVIL,ZESTRIL) 20 MG tablet Take 1 tablet (20 mg total) by mouth daily.  Marland Kitchen lovastatin (MEVACOR) 20 MG tablet Take 1 tablet (20 mg total) by mouth at bedtime.  . Probiotic Product (PROBIOTIC ADVANCED PO) Take by mouth.  Marland Kitchen tiZANidine (ZANAFLEX) 4 MG tablet Take 1 tablet (4 mg total) by mouth every 8 (eight) hours as needed for muscle spasms.  . vitamin B-12 (CYANOCOBALAMIN) 100 MCG tablet Take 100 mcg by mouth daily.  . diphenhydrAMINE (BENYLIN) 12.5 MG/5ML syrup Take by mouth 4 (four) times daily as needed for allergies.  . hydrocortisone 2.5 % cream Apply topically 2 (two) times daily.  . predniSONE (DELTASONE) 10 MG tablet Take 6 on day 1, 5 on day 2, 4 on day 3, 3 on day 4, 2 on day 5 and 1 on day 1 then stop.   No facility-administered encounter medications on file as of 01/11/2018.     Activities of Daily Living In your present state of health, do you have any difficulty performing the following activities: 01/11/2018  Hearing? N  Comment denies hearing aids; R hearing loss  Vision? N  Comment wears eyeglasses  Difficulty concentrating or making decisions? Y    Comment short term memory loss  Walking or climbing stairs? N  Dressing or bathing? N  Doing errands, shopping? N  Preparing Food and eating ? N  Comment denies dentures  Using the Toilet? N  In the past six months, have you accidently leaked urine? N  Do you have problems with loss of bowel control? N  Managing your Medications? N  Managing your Finances? N  Housekeeping or managing your Housekeeping? N  Some recent data might be hidden    Patient Care Team: Glean Hess, MD as PCP -  General (Internal Medicine)    Assessment:   This is a routine wellness examination for [place].  Exercise Activities and Dietary recommendations Current Exercise Habits: The patient does not participate in regular exercise at present, Exercise limited by: None identified  Goals    . Increase water intake     Recommend drinking at least 4-5 glasses of water a day     . Prevent falls     Recommend to remove any items from the home that may cause slips or trips.       Fall Risk Fall Risk  01/11/2018 01/05/2017 04/15/2016 04/13/2015  Falls in the past year? No No No No  Risk for fall due to : Impaired vision;Impaired balance/gait - - -  Risk for fall due to: Comment wears eyeglasses; R hearing loss - - -   FALL RISK PREVENTION PERTAINING TO HOME: Is your home free of loose throw rugs in walkways, pet beds, electrical cords, etc? Yes Is there adequate lighting in your home to reduce risk of falls?  Yes Are there stairs in or around your home WITH handrails? Yes  ASSISTIVE DEVICES UTILIZED TO PREVENT FALLS: Use of a cane, walker or w/c? No Grab bars in the bathroom? Yes  Shower chair or a place to sit while bathing? Yes An elevated toilet seat or a handicapped toilet? Yes  Timed Get Up and Go Performed: Yes. Pt ambulated 10 feet within 10 sec. Gait stead-fast and without the use of an assistive device. No intervention required at this time. Fall risk prevention has been  discussed.  Community Resource Referral:  [place] Referral not required at this time.   Depression Screen PHQ 2/9 Scores 01/11/2018 01/05/2017 04/15/2016 04/13/2015  PHQ - 2 Score 0 0 0 0  PHQ- 9 Score 0 - - -     Cognitive Function     6CIT Screen 01/11/2018 01/05/2017 04/15/2016  What Year? 0 points 0 points 0 points  What month? 0 points 0 points 0 points  What time? 0 points 0 points 0 points  Count back from 20 0 points 0 points 0 points  Months in reverse 0 points 0 points 2 points  Repeat phrase 0 points 0 points 0 points  Total Score 0 0 2    Immunization History  Administered Date(s) Administered  . Influenza, High Dose Seasonal PF 03/18/2017  . Influenza-Unspecified 01/24/2014, 02/25/2015, 03/16/2017  . Pneumococcal Conjugate-13 04/13/2015  . Pneumococcal Polysaccharide-23 01/24/2013  . Tdap 04/15/2016  . Zoster 05/27/2009    Qualifies for Shingles Vaccine? Yes. Zostavax completed 05/27/09. Due for Shingrix. Education has been provided regarding the importance of this vaccine. Pt has been advised to call insurance company to determine out of pocket expense. Advised may also receive vaccine at local pharmacy or Health Dept. Verbalized acceptance and understanding.  Screening Tests Health Maintenance  Topic Date Due  . INFLUENZA VACCINE  12/24/2017  . Hepatitis C Screening  05/27/2023 (Originally 31-Oct-1945)  . MAMMOGRAM  06/24/2018  . COLONOSCOPY  01/14/2026  . TETANUS/TDAP  04/15/2026  . PNA vac Low Risk Adult  Completed  . DEXA SCAN  Addressed    Cancer Screenings: Lung: Low Dose CT Chest recommended if Age 62-80 years, 30 pack-year currently smoking OR have quit w/in 15years. Patient does not qualify. Breast:  Up to date on Mammogram? Yes. Completed 06/24/17. Repeat every year   Up to date of Bone Density/Dexa? No. Completed 05/26/10. Taking alendronate for osteoporosis. Declined to repeat screening. Colorectal: Completed 01/15/16.  Repeat every 10  years  Additional Screenings: Hepatitis C Screening: Declined    Plan:  I have personally reviewed and addressed the Medicare Annual Wellness questionnaire and have noted the following in the patient's chart:  A. Medical and social history B. Use of alcohol, tobacco or illicit drugs  C. Current medications and supplements D. Functional ability and status E.  Nutritional status F.  Physical activity G. Advance directives H. List of other physicians I.  Hospitalizations, surgeries, and ER visits in previous 12 months J.  [place] such as hearing and vision if needed, cognitive and depression L. Referrals and appointments  In addition, I have reviewed and discussed with patient certain preventive protocols, quality metrics, and best practice recommendations. A written personalized care plan for preventive services as well as general preventive health recommendations were provided to patient.  Signed,  Aleatha Borer, LPN Nurse Health Advisor  MD Recommendations: Zostavax completed 05/27/09. Due for Shingrix. Education has been provided regarding the importance of this vaccine. Pt has been advised to call insurance company to determine out of pocket expense. Advised may also receive vaccine at local pharmacy or Health Dept. Verbalized acceptance and understanding.

## 2018-01-11 NOTE — Patient Instructions (Signed)
[name] , Thank you for taking time to come for your Medicare Wellness Visit. I appreciate your ongoing commitment to your health goals. Please review the following plan we discussed and let me know if I can assist you in the future.   Screening recommendations/referrals: Colorectal Screening: Up to date Mammogram: Up to date Bone Density: Declined  Vision and Dental Exams: Recommended annual ophthalmology exams for early detection of glaucoma and other disorders of the eye Recommended annual dental exams for proper oral hygiene  Vaccinations: Influenza vaccine: Up to date Pneumococcal vaccine: Up to date Tdap vaccine: Up to date Shingles vaccine: Please call your insurance company to determine your out of pocket expense for the Shingrix vaccine. You may also receive this vaccine at your local pharmacy or Health Dept.    Advanced directives: Please bring a copy of your POA (Power of Attorney) and/or Living Will to your next appointment.  Goals: Recommend to remove any items from the home that may cause slips or trips.  Next appointment: Please schedule your Annual Wellness Visit with your Nurse Health Advisor in one year.  Preventive Care 47 Years and Older, Female Preventive care refers to lifestyle choices and visits with your health care provider that can promote health and wellness. What does preventive care include?  A yearly physical exam. This is also called an annual well check.  Dental exams once or twice a year.  Routine eye exams. Ask your health care provider how often you should have your eyes checked.  Personal lifestyle choices, including:  Daily care of your teeth and gums.  Regular physical activity.  Eating a healthy diet.  Avoiding tobacco and drug use.  Limiting alcohol use.  Practicing safe sex.  Taking low-dose aspirin every day.  Taking vitamin and mineral supplements as recommended by your health care provider. What happens during an  annual well check? The services and screenings done by your health care provider during your annual well check will depend on your age, overall health, lifestyle risk factors, and family history of disease. Counseling  Your health care provider may ask you questions about your:  Alcohol use.  Tobacco use.  Drug use.  Emotional well-being.  Home and relationship well-being.  Sexual activity.  Eating habits.  History of falls.  Memory and ability to understand (cognition).  Work and work Statistician.  Reproductive health. Screening  You may have the following tests or measurements:  Height, weight, and BMI.  Blood pressure.  Lipid and cholesterol levels. These may be checked every 5 years, or more frequently if you are over 46 years old.  Skin check.  Lung cancer screening. You may have this screening every year starting at age 14 if you have a 30-pack-year history of smoking and currently smoke or have quit within the past 15 years.  Fecal occult blood test (FOBT) of the stool. You may have this test every year starting at age 87.  Flexible sigmoidoscopy or colonoscopy. You may have a sigmoidoscopy every 5 years or a colonoscopy every 10 years starting at age 65.  Hepatitis C blood test.  Hepatitis B blood test.  Sexually transmitted disease (STD) testing.  Diabetes screening. This is done by checking your blood sugar (glucose) after you have not eaten for a while (fasting). You may have this done every 1-3 years.  Bone density scan. This is done to screen for osteoporosis. You may have this done starting at age 42.  Mammogram. This may be done every 1-2  years. Talk to your health care provider about how often you should have regular mammograms. Talk with your health care provider about your test results, treatment options, and if necessary, the need for more tests. Vaccines  Your health care provider may recommend certain vaccines, such as:  Influenza  vaccine. This is recommended every year.  Tetanus, diphtheria, and acellular pertussis (Tdap, Td) vaccine. You may need a Td booster every 10 years.  Zoster vaccine. You may need this after age 77.  Pneumococcal 13-valent conjugate (PCV13) vaccine. One dose is recommended after age 10.  Pneumococcal polysaccharide (PPSV23) vaccine. One dose is recommended after age 39. Talk to your health care provider about which screenings and vaccines you need and how often you need them. This information is not intended to replace advice given to you by your health care provider. Make sure you discuss any questions you have with your health care provider. Document Released: 06/08/2015 Document Revised: 01/30/2016 Document Reviewed: 03/13/2015 Elsevier Interactive Patient Education  2017 [place] Prevention in the Home Falls can cause injuries. They can happen to people of all ages. There are many things you can do to make your home safe and to help prevent falls. What can I do on the outside of my home?  Regularly fix the edges of walkways and driveways and fix any cracks.  Remove anything that might make you trip as you walk through a door, such as a raised step or threshold.  Trim any bushes or trees on the path to your home.  Use bright outdoor lighting.  Clear any walking paths of anything that might make someone trip, such as rocks or tools.  Regularly check to see if handrails are loose or broken. Make sure that both sides of any steps have handrails.  Any raised decks and porches should have guardrails on the edges.  Have any leaves, snow, or ice cleared regularly.  Use sand or salt on walking paths during winter.  Clean up any spills in your garage right away. This includes oil or grease spills. What can I do in the bathroom?  Use night lights.  Install grab bars by the toilet and in the tub and shower. Do not use towel bars as grab bars.  Use non-skid mats or decals  in the tub or shower.  If you need to sit down in the shower, use a plastic, non-slip stool.  Keep the floor dry. Clean up any water that spills on the floor as soon as it happens.  Remove soap buildup in the tub or shower regularly.  Attach bath mats securely with double-sided non-slip rug tape.  Do not have throw rugs and other things on the floor that can make you trip. What can I do in the bedroom?  Use night lights.  Make sure that you have a light by your bed that is easy to reach.  Do not use any sheets or blankets that are too big for your bed. They should not hang down onto the floor.  Have a firm chair that has side arms. You can use this for support while you get dressed.  Do not have throw rugs and other things on the floor that can make you trip. What can I do in the kitchen?  Clean up any spills right away.  Avoid walking on wet floors.  Keep items that you use a lot in easy-to-reach places.  If you need to reach something above you, use a strong step  stool that has a grab bar.  Keep electrical cords out of the way.  Do not use floor polish or wax that makes floors slippery. If you must use wax, use non-skid floor wax.  Do not have throw rugs and other things on the floor that can make you trip. What can I do with my stairs?  Do not leave any items on the stairs.  Make sure that there are handrails on both sides of the stairs and use them. Fix handrails that are broken or loose. Make sure that handrails are as long as the stairways.  Check any carpeting to make sure that it is firmly attached to the stairs. Fix any carpet that is loose or worn.  Avoid having throw rugs at the top or bottom of the stairs. If you do have throw rugs, attach them to the floor with carpet tape.  Make sure that you have a light switch at the top of the stairs and the bottom of the stairs. If you do not have them, ask someone to add them for you. What else can I do to help  prevent falls?  Wear shoes that:  Do not have high heels.  Have rubber bottoms.  Are comfortable and fit you well.  Are closed at the toe. Do not wear sandals.  If you use a stepladder:  Make sure that it is fully opened. Do not climb a closed stepladder.  Make sure that both sides of the stepladder are locked into place.  Ask someone to hold it for you, if possible.  Clearly mark and make sure that you can see:  Any grab bars or handrails.  First and last steps.  Where the edge of each step is.  Use tools that help you move around (mobility aids) if they are needed. These include:  Canes.  Walkers.  Scooters.  Crutches.  Turn on the lights when you go into a dark area. Replace any light bulbs as soon as they burn out.  Set up your furniture so you have a clear path. Avoid moving your furniture around.  If any of your floors are uneven, fix them.  If there are any pets around you, be aware of where they are.  Review your medicines with your doctor. Some medicines can make you feel dizzy. This can increase your chance of falling. Ask your doctor what other things that you can do to help prevent falls. This information is not intended to replace advice given to you by your health care provider. Make sure you discuss any questions you have with your health care provider. Document Released: 03/08/2009 Document Revised: 10/18/2015 Document Reviewed: 06/16/2014 Elsevier Interactive Patient Education  2017 [employer].

## 2018-02-15 ENCOUNTER — Other Ambulatory Visit: Payer: Self-pay | Admitting: Internal Medicine

## 2018-02-15 DIAGNOSIS — I10 Essential (primary) hypertension: Secondary | ICD-10-CM

## 2018-03-01 DIAGNOSIS — H6123 Impacted cerumen, bilateral: Secondary | ICD-10-CM | POA: Diagnosis not present

## 2018-03-02 ENCOUNTER — Other Ambulatory Visit: Payer: Self-pay | Admitting: Internal Medicine

## 2018-03-02 DIAGNOSIS — E785 Hyperlipidemia, unspecified: Secondary | ICD-10-CM

## 2018-03-29 DIAGNOSIS — L568 Other specified acute skin changes due to ultraviolet radiation: Secondary | ICD-10-CM | POA: Diagnosis not present

## 2018-04-06 ENCOUNTER — Other Ambulatory Visit: Payer: Self-pay | Admitting: Internal Medicine

## 2018-04-06 DIAGNOSIS — I10 Essential (primary) hypertension: Secondary | ICD-10-CM

## 2018-04-09 DIAGNOSIS — H2513 Age-related nuclear cataract, bilateral: Secondary | ICD-10-CM | POA: Diagnosis not present

## 2018-04-16 ENCOUNTER — Encounter: Payer: Self-pay | Admitting: Internal Medicine

## 2018-04-16 ENCOUNTER — Ambulatory Visit (INDEPENDENT_AMBULATORY_CARE_PROVIDER_SITE_OTHER): Payer: PPO | Admitting: Internal Medicine

## 2018-04-16 VITALS — BP 122/68 | HR 74 | Ht 60.0 in | Wt 117.0 lb

## 2018-04-16 DIAGNOSIS — I10 Essential (primary) hypertension: Secondary | ICD-10-CM | POA: Diagnosis not present

## 2018-04-16 DIAGNOSIS — M810 Age-related osteoporosis without current pathological fracture: Secondary | ICD-10-CM

## 2018-04-16 DIAGNOSIS — Z1231 Encounter for screening mammogram for malignant neoplasm of breast: Secondary | ICD-10-CM

## 2018-04-16 DIAGNOSIS — Z0000 Encounter for general adult medical examination without abnormal findings: Secondary | ICD-10-CM

## 2018-04-16 DIAGNOSIS — E785 Hyperlipidemia, unspecified: Secondary | ICD-10-CM

## 2018-04-16 DIAGNOSIS — F411 Generalized anxiety disorder: Secondary | ICD-10-CM | POA: Diagnosis not present

## 2018-04-16 LAB — POCT URINALYSIS DIPSTICK
Bilirubin, UA: NEGATIVE
Blood, UA: NEGATIVE
GLUCOSE UA: NEGATIVE
KETONES UA: NEGATIVE
Leukocytes, UA: NEGATIVE
NITRITE UA: NEGATIVE
PROTEIN UA: NEGATIVE
Spec Grav, UA: 1.01 (ref 1.010–1.025)
Urobilinogen, UA: 0.2 E.U./dL
pH, UA: 6 (ref 5.0–8.0)

## 2018-04-16 NOTE — Progress Notes (Signed)
Date:  04/16/2018   Name:  Brittany Alvarez   DOB:  [birth year]   MRN:  [id number]   Chief Complaint: Annual Exam (Breast Exam. ) DAMIRA Alvarez is a 72 y.o. female who presents today for her Complete Annual Exam. She feels fairly well. She reports exercising some. She reports she is sleeping fairly well. She is up to date on vaccinations.  Mammogram due in January. She has not had DEXA in many years.  Hypertension  Associated symptoms include anxiety. Pertinent negatives include no chest pain, headaches, palpitations or shortness of breath. Past treatments include ACE inhibitors. The current treatment provides significant improvement.  Hyperlipidemia  The problem is controlled. Pertinent negatives include no chest pain or shortness of breath. Current antihyperlipidemic treatment includes statins.  Anxiety  Presents for follow-up visit. Patient reports no chest pain, dizziness, nervous/anxious behavior, palpitations or shortness of breath. Symptoms occur most days. The severity of symptoms is moderate (but improved since taking Buspar daily).    Osteoporosis - she is taking fosamax weekly but has not had DEXA for a while.  She does not take calcium or vitamin D supplement.   Review of Systems  Constitutional: Negative for chills, fatigue and fever.  HENT: Negative for congestion, hearing loss, tinnitus, trouble swallowing and voice change.   Eyes: Positive for visual disturbance (cataracts).  Respiratory: Negative for cough, chest tightness, shortness of breath and wheezing.   Cardiovascular: Negative for chest pain, palpitations and leg swelling.  Gastrointestinal: Negative for abdominal pain, constipation, diarrhea and vomiting.  Endocrine: Negative for polydipsia and polyuria.  Genitourinary: Negative for dysuria, frequency, genital sores, vaginal bleeding and vaginal discharge.  Musculoskeletal: Negative for arthralgias, gait problem and joint swelling.  Skin: Negative for color  change and rash.  Neurological: Negative for dizziness, tremors, light-headedness and headaches.  Hematological: Negative for adenopathy. Does not bruise/bleed easily.  Psychiatric/Behavioral: Negative for dysphoric mood and sleep disturbance. The patient is not nervous/anxious.     Patient Active Problem List   Diagnosis Date Noted  . Deaf 04/13/2015  . Generalized anxiety disorder 03/04/2015  . Benign colonic polyp 03/04/2015  . Degeneration of intervertebral disc of lumbar region 03/04/2015  . Dyslipidemia 03/04/2015  . Allergic state 03/04/2015  . Essential (primary) hypertension 03/04/2015  . Calcium blood increased 03/04/2015  . Arthritis of hand, degenerative 03/04/2015  . Osteoporosis without current pathological fracture 03/04/2015  . History of benign schwannoma 03/04/2015    Allergies  Allergen Reactions  . Povidone Iodine Rash  . Latex Hives    Patient states painful and rash  . Thiazide-Type Diuretics     Hypercalcemia  . Diflucan [Fluconazole] Rash    Possible rash that occurred one week after completing one week of diflucan    Past Surgical History:  Procedure Laterality Date  . ANTERIOR LAT LUMBAR FUSION  06/24/2011   Procedure: ANTERIOR LATERAL LUMBAR FUSION 1 LEVEL;  Surgeon: Faythe Ghee, MD;  Location: [place] [facility];  Service: Neurosurgery;  Laterality: Right;  Right Lumbar Three-Four Anterior Lateral Lumbar Interbody Fusion with Right Lumbar Three-Four percutaneous pedicle screws (Lateral Postion for both procedures)  . APPENDECTOMY    . BACK SURGERY     x 2  . BREAST CYST ASPIRATION Left 1990   NEG  . BREAST EXCISIONAL BIOPSY Left age 81's   NEG  . CHOLECYSTECTOMY    . COLONOSCOPY WITH PROPOFOL N/A 01/15/2016   Procedure: COLONOSCOPY WITH PROPOFOL;  Surgeon: Lollie Sails, MD;  Location: [facility] ENDOSCOPY;  Service: Endoscopy;  Laterality: N/A;  . OVARY SURGERY     removal of left and left tube  . TONSILLECTOMY    . vestibular tumor resection  Right 2016   schwannoma    Social History   Tobacco Use  . Smoking status: Never Smoker  . Smokeless tobacco: Never Used  . Tobacco comment: smoking cessation materials not required  Substance Use Topics  . Alcohol use: Yes    Alcohol/week: 0.0 standard drinks    Comment: occ  . Drug use: No     Medication list has been reviewed and updated.  Current Meds  Medication Sig  . alendronate (FOSAMAX) 70 MG tablet Take 1 tablet (70 mg total) by mouth once a week. Take with a full glass of water on an empty stomach.  Marland Kitchen aspirin 81 MG tablet Take 81 mg by mouth daily.  . busPIRone (BUSPAR) 5 MG tablet TAKE ONE TABLET BY MOUTH 3 TIMES DAILY FOR ANXIETY  . diphenhydrAMINE (BENYLIN) 12.5 MG/5ML syrup Take by mouth 4 (four) times daily as needed for allergies.  . furosemide (LASIX) 20 MG tablet TAKE 1 TABLET BY MOUTH ONCE DAILY  . hydrocortisone 2.5 % cream Apply topically 2 (two) times daily.  Marland Kitchen lisinopril (PRINIVIL,ZESTRIL) 20 MG tablet TAKE 1 TABLET BY MOUTH ONCE DAILY  . lovastatin (MEVACOR) 20 MG tablet TAKE 2 TABLETS BY MOUTH AT BEDTIME AS DIRECTED  . Probiotic Product (PROBIOTIC ADVANCED PO) Take by mouth.  Marland Kitchen tiZANidine (ZANAFLEX) 4 MG tablet Take 1 tablet (4 mg total) by mouth every 8 (eight) hours as needed for muscle spasms.  . vitamin B-12 (CYANOCOBALAMIN) 100 MCG tablet Take 100 mcg by mouth daily.    PHQ 2/9 Scores 04/16/2018 01/11/2018 01/05/2017 04/15/2016  PHQ - 2 Score 1 0 0 0  PHQ- 9 Score - 0 - -   Fall Risk  01/11/2018 01/05/2017 04/15/2016 04/13/2015  Falls in the past year? No No No No  Risk for fall due to : Impaired vision;Impaired balance/gait - - -  Risk for fall due to: Comment wears eyeglasses; R hearing loss - - -    Physical Exam  Constitutional: She is oriented to person, place, and time. She appears well-developed and well-nourished. No distress.  HENT:  Head: Normocephalic and atraumatic.  Right Ear: Tympanic membrane and ear canal normal.  Left Ear:  Tympanic membrane and ear canal normal.  Nose: Right sinus exhibits no maxillary sinus tenderness. Left sinus exhibits no maxillary sinus tenderness.  Mouth/Throat: Uvula is midline and oropharynx is clear and moist.  Eyes: Conjunctivae and EOM are normal. Right eye exhibits no discharge. Left eye exhibits no discharge. No scleral icterus.  Neck: Normal range of motion. Carotid bruit is not present. No erythema present. No thyromegaly present.  Cardiovascular: Normal rate, regular rhythm, normal heart sounds and normal pulses.  Pulmonary/Chest: Effort normal. No respiratory distress. She has no wheezes. Right breast exhibits no mass, no nipple discharge, no skin change and no tenderness. Left breast exhibits no mass, no nipple discharge, no skin change and no tenderness.  Abdominal: Soft. Bowel sounds are normal. There is no hepatosplenomegaly. There is no tenderness. There is no CVA tenderness.  Musculoskeletal: Normal range of motion. She exhibits no edema or tenderness.  Lymphadenopathy:    She has no cervical adenopathy.    She has no axillary adenopathy.  Neurological: She is alert and oriented to person, place, and time. She has normal reflexes. No cranial nerve deficit or sensory deficit.  Skin:  Skin is warm, dry and intact. No rash noted.  Psychiatric: She has a normal mood and affect. Her speech is normal and behavior is normal. Thought content normal.  Nursing note and vitals reviewed.   BP 122/68 (BP Location: Right Arm, Patient Position: Sitting, Cuff Size: Normal)   Pulse 74   Ht 5' (1.524 m)   Wt 117 lb (53.1 kg)   SpO2 98%   BMI 22.85 kg/m   Assessment and Plan: 1. Annual physical exam Normal exam Continue healthy diet, exercise if able - POCT urinalysis dipstick  2. Encounter for screening mammogram for breast cancer - MM 3D SCREEN BREAST BILATERAL; Future  3. Essential (primary) hypertension controlled - CBC with Differential/Platelet - Comprehensive metabolic  panel - TSH  4. Generalized anxiety disorder Doing well on Buspar  5. Dyslipidemia On statin therapy - Lipid panel  6. Osteoporosis without current pathological fracture, unspecified osteoporosis type Check vitamin D level and advise - HM DEXA SCAN - VITAMIN D 25 Hydroxy (Vit-D Deficiency, Fractures)   Partially dictated using Editor, commissioning. Any errors are unintentional.  Halina Maidens, MD [facility]  04/16/2018

## 2018-04-16 NOTE — Patient Instructions (Signed)

## 2018-04-17 ENCOUNTER — Encounter: Payer: Self-pay | Admitting: Internal Medicine

## 2018-04-17 DIAGNOSIS — E559 Vitamin D deficiency, unspecified: Secondary | ICD-10-CM | POA: Insufficient documentation

## 2018-04-17 LAB — CBC WITH DIFFERENTIAL/PLATELET
BASOS ABS: 0.1 10*3/uL (ref 0.0–0.2)
BASOS: 1 %
EOS (ABSOLUTE): 0.2 10*3/uL (ref 0.0–0.4)
Eos: 3 %
Hematocrit: 40.5 % (ref 34.0–46.6)
Hemoglobin: 13.4 g/dL (ref 11.1–15.9)
Immature Grans (Abs): 0 10*3/uL (ref 0.0–0.1)
Immature Granulocytes: 0 %
LYMPHS ABS: 2 10*3/uL (ref 0.7–3.1)
LYMPHS: 33 %
MCH: 27.9 pg (ref 26.6–33.0)
MCHC: 33.1 g/dL (ref 31.5–35.7)
MCV: 84 fL (ref 79–97)
Monocytes Absolute: 0.4 10*3/uL (ref 0.1–0.9)
Monocytes: 7 %
NEUTROS ABS: 3.3 10*3/uL (ref 1.4–7.0)
NEUTROS PCT: 56 %
Platelets: 334 10*3/uL (ref 150–450)
RBC: 4.81 x10E6/uL (ref 3.77–5.28)
RDW: 12.5 % (ref 12.3–15.4)
WBC: 6 10*3/uL (ref 3.4–10.8)

## 2018-04-17 LAB — COMPREHENSIVE METABOLIC PANEL
ALK PHOS: 72 IU/L (ref 39–117)
ALT: 11 IU/L (ref 0–32)
AST: 23 IU/L (ref 0–40)
Albumin/Globulin Ratio: 2.1 (ref 1.2–2.2)
Albumin: 5 g/dL — ABNORMAL HIGH (ref 3.5–4.8)
BILIRUBIN TOTAL: 0.6 mg/dL (ref 0.0–1.2)
BUN / CREAT RATIO: 28 (ref 12–28)
BUN: 30 mg/dL — AB (ref 8–27)
CHLORIDE: 101 mmol/L (ref 96–106)
CO2: 24 mmol/L (ref 20–29)
CREATININE: 1.08 mg/dL — AB (ref 0.57–1.00)
Calcium: 10.2 mg/dL (ref 8.7–10.3)
GFR calc Af Amer: 60 mL/min/{1.73_m2} (ref >59)
GFR calc non Af Amer: 52 mL/min/{1.73_m2} — ABNORMAL LOW (ref >59)
GLUCOSE: 93 mg/dL (ref 65–99)
Globulin, Total: 2.4 g/dL (ref 1.5–4.5)
Potassium: 4.5 mmol/L (ref 3.5–5.2)
SODIUM: 141 mmol/L (ref 134–144)
Total Protein: 7.4 g/dL (ref 6.0–8.5)

## 2018-04-17 LAB — LIPID PANEL
CHOLESTEROL TOTAL: 221 mg/dL — AB (ref 100–199)
Chol/HDL Ratio: 2.6 ratio (ref 0.0–4.4)
HDL: 86 mg/dL (ref >39)
LDL CALC: 118 mg/dL — AB (ref 0–99)
TRIGLYCERIDES: 86 mg/dL (ref 0–149)
VLDL CHOLESTEROL CAL: 17 mg/dL (ref 5–40)

## 2018-04-17 LAB — VITAMIN D 25 HYDROXY (VIT D DEFICIENCY, FRACTURES): Vit D, 25-Hydroxy: 28.6 ng/mL — ABNORMAL LOW (ref 30.0–100.0)

## 2018-04-17 LAB — TSH: TSH: 2.13 u[IU]/mL (ref 0.450–4.500)

## 2018-04-19 ENCOUNTER — Other Ambulatory Visit: Payer: Self-pay | Admitting: Internal Medicine

## 2018-04-19 DIAGNOSIS — F419 Anxiety disorder, unspecified: Secondary | ICD-10-CM

## 2018-04-30 ENCOUNTER — Other Ambulatory Visit: Payer: Self-pay | Admitting: Internal Medicine

## 2018-04-30 DIAGNOSIS — M810 Age-related osteoporosis without current pathological fracture: Secondary | ICD-10-CM

## 2018-05-27 ENCOUNTER — Other Ambulatory Visit: Payer: Self-pay | Admitting: Internal Medicine

## 2018-06-15 ENCOUNTER — Ambulatory Visit
Admission: RE | Admit: 2018-06-15 | Discharge: 2018-06-15 | Disposition: A | Discharge status: Home / Self Care | Payer: PPO | Source: Ambulatory Visit | Attending: Internal Medicine | Admitting: Internal Medicine

## 2018-06-15 DIAGNOSIS — M810 Age-related osteoporosis without current pathological fracture: Secondary | ICD-10-CM | POA: Insufficient documentation

## 2018-06-15 DIAGNOSIS — M85832 Other specified disorders of bone density and structure, left forearm: Secondary | ICD-10-CM | POA: Diagnosis not present

## 2018-06-25 ENCOUNTER — Ambulatory Visit
Admission: RE | Admit: 2018-06-25 | Discharge: 2018-06-25 | Disposition: A | Discharge status: Home / Self Care | Payer: PPO | Source: Ambulatory Visit | Attending: Internal Medicine | Admitting: Internal Medicine

## 2018-06-25 DIAGNOSIS — Z1231 Encounter for screening mammogram for malignant neoplasm of breast: Secondary | ICD-10-CM | POA: Diagnosis not present

## 2018-07-12 ENCOUNTER — Other Ambulatory Visit: Payer: Self-pay | Admitting: Internal Medicine

## 2018-07-12 DIAGNOSIS — I10 Essential (primary) hypertension: Secondary | ICD-10-CM

## 2018-10-15 ENCOUNTER — Encounter: Payer: Self-pay | Admitting: Internal Medicine

## 2018-10-15 ENCOUNTER — Other Ambulatory Visit: Payer: Self-pay

## 2018-10-15 ENCOUNTER — Ambulatory Visit: Payer: PPO | Admitting: Internal Medicine

## 2018-10-15 ENCOUNTER — Ambulatory Visit (INDEPENDENT_AMBULATORY_CARE_PROVIDER_SITE_OTHER): Payer: PPO | Admitting: Internal Medicine

## 2018-10-15 VITALS — BP 122/84 | HR 76 | Ht 60.0 in | Wt 121.0 lb

## 2018-10-15 DIAGNOSIS — F411 Generalized anxiety disorder: Secondary | ICD-10-CM | POA: Diagnosis not present

## 2018-10-15 DIAGNOSIS — E559 Vitamin D deficiency, unspecified: Secondary | ICD-10-CM | POA: Diagnosis not present

## 2018-10-15 DIAGNOSIS — E785 Hyperlipidemia, unspecified: Secondary | ICD-10-CM

## 2018-10-15 DIAGNOSIS — I10 Essential (primary) hypertension: Secondary | ICD-10-CM

## 2018-10-15 MED ORDER — BUSPIRONE HCL 10 MG PO TABS: 10.0000 mg | ORAL_TABLET | Freq: Two times a day (BID) | ORAL | 5 refills | Status: DC

## 2018-10-15 MED ORDER — BUSPIRONE HCL 10 MG PO TABS: ORAL_TABLET | ORAL | 5 refills | Status: DC

## 2018-10-15 NOTE — Progress Notes (Signed)
Date:  10/15/2018   Name:  Brittany Alvarez   DOB:  1945-08-11   MRN:  [id number]   Chief Complaint: Follow-up (GAD7=14 and PHQ9=5/ feels like buspirone needs to be increased)  Hypertension  This is a chronic problem. The problem is controlled. Associated symptoms include anxiety. Pertinent negatives include no chest pain, headaches, palpitations or shortness of breath. Past treatments include ACE inhibitors. The current treatment provides significant improvement. There is no history of kidney disease or CAD/MI.  Anxiety  Presents for follow-up visit. Patient reports no chest pain, palpitations or shortness of breath. Symptoms occur occasionally. The quality of sleep is good.   Compliance with medications: taking buspar 5 mg bid - would like to increase.  Hyperlipidemia  The problem is controlled. Pertinent negatives include no chest pain, myalgias or shortness of breath. Current antihyperlipidemic treatment includes statins. The current treatment provides significant improvement of lipids.  Vitamin D def - very low 6 months ago, started daily supplements.  Vitamin D = 28  Lab Results  Component Value Date   CREATININE 1.08 (H) 04/16/2018   BUN 30 (H) 04/16/2018   NA 141 04/16/2018   K 4.5 04/16/2018   CL 101 04/16/2018   CO2 24 04/16/2018   Lab Results  Component Value Date   TSH 2.130 04/16/2018   Lab Results  Component Value Date   CHOL 221 (H) 04/16/2018   HDL 86 04/16/2018   LDLCALC 118 (H) 04/16/2018   TRIG 86 04/16/2018   CHOLHDL 2.6 04/16/2018     Review of Systems  Constitutional: Negative for appetite change, fatigue, fever and unexpected weight change.  HENT: Negative for tinnitus and trouble swallowing.   Eyes: Negative for visual disturbance.  Respiratory: Negative for cough, chest tightness and shortness of breath.   Cardiovascular: Negative for chest pain, palpitations and leg swelling.  Gastrointestinal: Negative for abdominal pain.  Genitourinary:  Negative for dysuria and hematuria.  Musculoskeletal: Negative for arthralgias, gait problem and myalgias.  Neurological: Negative for tremors, numbness and headaches.  Psychiatric/Behavioral: Negative for dysphoric mood and sleep disturbance.    Patient Active Problem List   Diagnosis Date Noted  . Vitamin D deficiency 04/17/2018  . Deaf 04/13/2015  . Generalized anxiety disorder 03/04/2015  . Benign colonic polyp 03/04/2015  . Degeneration of intervertebral disc of lumbar region 03/04/2015  . Dyslipidemia 03/04/2015  . Allergic state 03/04/2015  . Essential (primary) hypertension 03/04/2015  . Calcium blood increased 03/04/2015  . Arthritis of hand, degenerative 03/04/2015  . Osteoporosis without current pathological fracture 03/04/2015  . History of benign schwannoma 03/04/2015    Allergies  Allergen Reactions  . Povidone Iodine Rash  . Latex Hives    Patient states painful and rash  . Thiazide-Type Diuretics     Hypercalcemia  . Diflucan [Fluconazole] Rash    Possible rash that occurred one week after completing one week of diflucan    Past Surgical History:  Procedure Laterality Date  . ANTERIOR LAT LUMBAR FUSION  06/24/2011   Procedure: ANTERIOR LATERAL LUMBAR FUSION 1 LEVEL;  Surgeon: Faythe Ghee, MD;  Location: [place] [facility];  Service: Neurosurgery;  Laterality: Right;  Right Lumbar Three-Four Anterior Lateral Lumbar Interbody Fusion with Right Lumbar Three-Four percutaneous pedicle screws (Lateral Postion for both procedures)  . APPENDECTOMY    . BACK SURGERY     x 2  . BREAST CYST ASPIRATION Left 1990   NEG  . BREAST EXCISIONAL BIOPSY Left age 89's   NEG  .  CHOLECYSTECTOMY    . COLONOSCOPY WITH PROPOFOL N/A 01/15/2016   Procedure: COLONOSCOPY WITH PROPOFOL;  Surgeon: Lollie Sails, MD;  Location: [facility] ENDOSCOPY;  Service: Endoscopy;  Laterality: N/A;  . OVARY SURGERY     removal of left and left tube  . TONSILLECTOMY    . vestibular tumor resection  Right 2016   schwannoma    Social History   Tobacco Use  . Smoking status: Never Smoker  . Smokeless tobacco: Never Used  . Tobacco comment: smoking cessation materials not required  Substance Use Topics  . Alcohol use: Yes    Alcohol/week: 0.0 standard drinks    Comment: occ  . Drug use: No     Medication list has been reviewed and updated.  Current Meds  Medication Sig  . alendronate (FOSAMAX) 70 MG tablet TAKE ONE TABLET BY MOUTH EACH WEEK, ON AN EMPTY STOMACH BEFORE BREAKFAST WITH 8oz OF WATER AND REMAIN UPRIGHT FOR :30  . aspirin 81 MG tablet Take 81 mg by mouth daily.  . Cholecalciferol (VITAMIN D3 SUPER STRENGTH) 50 MCG (2000 UT) CAPS Take 1 capsule by mouth daily.  . furosemide (LASIX) 20 MG tablet TAKE 1 TABLET BY MOUTH ONCE DAILY  . lisinopril (PRINIVIL,ZESTRIL) 20 MG tablet TAKE 1 TABLET BY MOUTH ONCE DAILY  . loratadine (CLARITIN) 10 MG tablet Take 10 mg by mouth daily. otc  . lovastatin (MEVACOR) 20 MG tablet TAKE 2 TABLETS BY MOUTH AT BEDTIME AS DIRECTED  . Probiotic Product (PROBIOTIC ADVANCED PO) Take by mouth.  Marland Kitchen tiZANidine (ZANAFLEX) 4 MG tablet TAKE 1 TABLET BY MOUTH EVERY 8 HOURS AS NEEDED FOR MUSCLE SPASMS  . vitamin B-12 (CYANOCOBALAMIN) 100 MCG tablet Take 100 mcg by mouth daily.  . [DISCONTINUED] busPIRone (BUSPAR) 10 MG tablet Take 1 tablet (10 mg total) by mouth 2 (two) times daily. TAKE 1 TABLET BY MOUTH [place]  . [DISCONTINUED] busPIRone (BUSPAR) 5 MG tablet TAKE 1 TABLET BY MOUTH 3 TIMES DAILY FORANXIETY    PHQ 2/9 Scores 10/15/2018 04/16/2018 01/11/2018 01/05/2017  PHQ - 2 Score 2 1 0 0  PHQ- 9 Score 5 - 0 -   GAD 7 : Generalized Anxiety Score 10/15/2018  Nervous, Anxious, on Edge 2  Control/stop worrying 1  Worry too much - different things 2  Trouble relaxing 1  Restless 3  Easily annoyed or irritable 3  Afraid - awful might happen 2  Total GAD 7 Score 14  Anxiety Difficulty Somewhat difficult      BP Readings from  Last 3 Encounters:  10/15/18 122/84  04/16/18 122/68  01/11/18 138/70    Physical Exam Vitals signs and nursing note reviewed.  Constitutional:      General: She is not in acute distress.    Appearance: She is well-developed.  HENT:     Head: Normocephalic and atraumatic.  Pulmonary:     Effort: Pulmonary effort is normal. No respiratory distress.  Musculoskeletal: Normal range of motion.  Skin:    General: Skin is warm and dry.     Findings: No rash.  Neurological:     Mental Status: She is alert and oriented to person, place, and time.  Psychiatric:        Behavior: Behavior normal.        Thought Content: Thought content normal.    Wt Readings from Last 3 Encounters:  10/15/18 121 lb (54.9 kg)  04/16/18 117 lb (53.1 kg)  01/11/18 117 lb 6.4 oz (53.3 kg)  BP 122/84   Pulse 76   Ht 5' (1.524 m)   Wt 121 lb (54.9 kg)   SpO2 99%   BMI 23.63 kg/m   Assessment and Plan: 1. Essential (primary) hypertension controlled  2. Dyslipidemia On statin therapy  3. Generalized anxiety disorder Increase dose of Buspar  4. Vitamin D deficiency Continue daily supplement   Partially dictated using Editor, commissioning. Any errors are unintentional.  Halina Maidens, MD [facility]  10/15/2018

## 2019-01-04 ENCOUNTER — Telehealth: Payer: Self-pay | Admitting: Internal Medicine

## 2019-01-04 DIAGNOSIS — Z86010 Personal history of colonic polyps: Secondary | ICD-10-CM | POA: Diagnosis not present

## 2019-01-04 NOTE — Chronic Care Management (AMB) (Signed)
Chronic Care Management  ° °Note ° °01/04/2019 °Name: Nuria P Schoenfeld MRN: [id number] DOB: 05/15/1946 ° °Cornisha P Moen is a 73 y.o. year old female who is a primary care patient of Berglund, Laura H, MD. I reached out to Grettel P Congrove by phone today in response to a referral sent by Ms. Callista P Corne's health plan.   ° °Ms. Heeg was given information about Chronic Care Management services today including:  °1. CCM service includes personalized support from designated clinical staff supervised by her physician, including individualized plan of care and coordination with other care providers °2. 24/7 contact phone numbers for assistance for urgent and routine care needs. °3. Service will only be billed when office clinical staff spend 20 minutes or more in a month to coordinate care. °4. Only one practitioner may furnish and bill the service in a calendar month. °5. The patient may stop CCM services at any time (effective at the end of the month) by phone call to the office staff. °6. The patient will be responsible for cost sharing (co-pay) of up to 20% of the service fee (after annual deductible is met). ° °Patient agreed to services and verbal consent obtained.  ° °Follow up plan: °Telephone appointment with CCM team member scheduled for: 01/05/2019 ° °Bernice Cicero °Care Guide • [facility] °[place]   [facility]  °[email or web address]   ??[phone]   ° ° ° °

## 2019-01-05 ENCOUNTER — Ambulatory Visit: Payer: PPO | Admitting: Pharmacist

## 2019-01-05 DIAGNOSIS — E559 Vitamin D deficiency, unspecified: Secondary | ICD-10-CM

## 2019-01-05 DIAGNOSIS — I10 Essential (primary) hypertension: Secondary | ICD-10-CM

## 2019-01-05 DIAGNOSIS — E785 Hyperlipidemia, unspecified: Secondary | ICD-10-CM

## 2019-01-05 DIAGNOSIS — F411 Generalized anxiety disorder: Secondary | ICD-10-CM

## 2019-01-05 NOTE — Patient Instructions (Signed)
Thank you allowing the Chronic Care Management Team to be a part of your care! It was a pleasure speaking with you today!     CCM (Chronic Care Management) Team    Janci Minor RN, BSN Nurse Care Coordinator  [phone]   Harlow Asa PharmD  Clinical Pharmacist  [phone]   Eula Fried LCSW Clinical Social Worker [phone]  Visit Information  Goals Addressed            This Visit's Progress   . Medication Adherence       Current Barriers:  Marland Kitchen Knowledge Deficits related to hypertension disease state and monitoring  Pharmacist Clinical Goal(s):  Marland Kitchen Over the next 30 days, patient will work with CM Pharmacist to address needs related to medication regimen optimization  Interventions: . Assess adherence to lovastatin (as identified by health plan).  o Patient denies missed doses of lovastatin.  o Reports using both AM and PM weekly pillboxes to organize her medications. o Review dispensing history in chart. No gaps in refill history noted. o Encourage patient to continue adherence to medication and use of weekly pillbox . Perform comprehensive medication review o Counsel to use caution for dizziness/sedation with tizanidine.  - Ms. Loudenslager reports taking tizanidine typically only at bedtime if needed for muscle spasms. Admits to sedation with medication and reports that if needed during the day, she lies down after taking. o Reports started a new supplement for her memory about 1 month ago, Cognium 100 mg - 1 tablet once daily.  - Counsel patient regarding lack of data to confirm whether this supplement is safe, effective or could interact with her medications.  - Review data from Natural Medicines Database - currently no safety or efficacy data available on Cognium in this resource. . Will provide educational Emmi handout: "How to Help Your Memory" . Counsel patient about COVID-19 prevention, including handwashing, social distancing and face masking as  recommended by CDC. Marland Kitchen Counsel on blood pressure control and monitoring o Ms. Dettmer reports that she has a home upper arm monitor and checks blood pressure occasionally, but does not record results o Encourage patient to continue to check her blood pressure, but to also record results and bring log with her to PCP visits. . Will mail patient a home blood pressure log as requested, along with AHA handout on monitoring technique  Patient Self Care Activities:  . Self administers medications as prescribed . Attends all scheduled provider appointments . Calls pharmacy for medication refills . Calls provider office for new concerns or questions  Initial goal documentation        The patient verbalized understanding of instructions provided today and declined a print copy of patient instruction materials.   The care management team will reach out to the patient again over the next 30 days.   Harlow Asa, PharmD, [facility] [employer] [phone]

## 2019-01-05 NOTE — Chronic Care Management (AMB) (Signed)
Chronic Care Management   Note  01/05/2019 Name: Brittany Alvarez MRN: [id number] DOB: [birth year]   Subjective:   Brittany Alvarez  is Alvarez 73 y.o. year old female who is Alvarez primary care patient of Brittany Maidens, MD. The CCM team was consulted for assistance with chronic disease management and care coordination needs by Brittany Alvarez's health plan. Brittany Alvarez has Alvarez past medical history including but not limited to hypertension, anxiety, hyperlipidemia, osteoporosis and Vitamin D deficiency.   I reached out to patient by phone today as scheduled by Care Guide to discuss patient's medication adherence to lovastatin.  Review of patient status, including review of consultants reports, laboratory and other test data, was performed as part of comprehensive evaluation and provision of chronic care management services.   Objective:  Lab Results  Component Value Date   CREATININE 1.08 (H) 04/16/2018   CREATININE 1.02 (H) 03/30/2017   CREATININE 0.80 04/15/2016    No results found for: HGBA1C     Component Value Date/Time   CHOL 221 (H) 04/16/2018 0856   TRIG 86 04/16/2018 0856   HDL 86 04/16/2018 0856   CHOLHDL 2.6 04/16/2018 0856   LDLCALC 118 (H) 04/16/2018 0856    BP Readings from Last 3 Encounters:  10/15/18 122/84  04/16/18 122/68  01/11/18 138/70    Allergies  Allergen Reactions  . Povidone Iodine Rash  . Latex Hives    Patient states painful and rash  . Thiazide-Type Diuretics     Hypercalcemia  . Diflucan [Fluconazole] Rash    Possible rash that occurred one week after completing one week of diflucan    Medications Reviewed Today    Reviewed by Brittany Alvarez (Pharmacist) on 01/05/19 at 1325  Med List Status: <None>  Medication Order Taking? Sig Documenting Provider Last Dose Status Informant  alendronate (FOSAMAX) 70 MG tablet [id number] Yes TAKE ONE TABLET BY MOUTH EACH WEEK, ON AN EMPTY STOMACH BEFORE BREAKFAST WITH 8oz OF WATER AND REMAIN UPRIGHT FOR :30  Glean Hess, MD Taking Active            Med Note [facility], Brittany Alvarez   Wed Jan 05, 2019  1:08 PM) On Sundays  aspirin 81 MG tablet [id number] Yes Take 81 mg by mouth daily. [provider] Taking Active   busPIRone (BUSPAR) 10 MG tablet [id number] Yes Take one bid Glean Hess, MD Taking Active   Cholecalciferol (VITAMIN D3 SUPER STRENGTH) 50 MCG (2000 UT) CAPS [id number] Yes Take 1 capsule by mouth daily. [provider] Taking Active   furosemide (LASIX) 20 MG tablet [id number] Yes TAKE 1 TABLET BY MOUTH ONCE DAILY Glean Hess, MD Taking Active   lisinopril (PRINIVIL,ZESTRIL) 20 MG tablet [id number] Yes TAKE 1 TABLET BY MOUTH ONCE DAILY Glean Hess, MD Taking Active   loratadine (CLARITIN) 10 MG tablet [id number] No Take 10 mg by mouth daily. otc [provider] Not Taking Active            Med Note [facility], Omer Monter Alvarez   Wed Jan 05, 2019  1:18 PM) Takes seasonally as needed  lovastatin (MEVACOR) 20 MG tablet [id number] Yes TAKE 2 TABLETS BY MOUTH AT BEDTIME AS DIRECTED Glean Hess, MD Taking Active   Probiotic Product (PROBIOTIC ADVANCED PO) [id number] Yes Take by mouth. [provider] Taking Active   tiZANidine (ZANAFLEX) 4 MG tablet [id number] Yes TAKE 1 TABLET BY MOUTH EVERY 8 HOURS AS NEEDED FOR MUSCLE SPASMS Glean Hess, MD  Taking Active   vitamin B-12 (CYANOCOBALAMIN) 100 MCG tablet [id number] No Take 100 mcg by mouth daily. [provider] Not Taking Active            Assessment:   Goals Addressed            This Visit's Progress   . Medication Adherence       Current Barriers:  Marland Kitchen Knowledge Deficits related to hypertension disease state and monitoring  Pharmacist Clinical Goal(s):  Marland Kitchen Over the next 30 days, patient will work with CM Pharmacist to address needs related to medication regimen optimization  Interventions: . Assess adherence to lovastatin (as identified by health plan).  o Patient denies  missed doses of lovastatin.  o Reports using both AM and PM weekly pillboxes to organize her medications. o Review dispensing history in chart. No gaps in refill history noted. o Encourage patient to continue adherence to medication and use of weekly pillbox . Perform comprehensive medication review o Counsel to use caution for dizziness/sedation with tizanidine.  - [name] reports taking tizanidine typically only at bedtime if needed for muscle spasms. Admits to sedation with medication and reports that if needed during the day, she lies down after taking. o Reports started Alvarez new supplement for her memory about 1 month ago, Cognium 100 mg - 1 tablet once daily.  - Counsel patient regarding lack of data to confirm whether this supplement is safe, effective or could interact with her medications.  - Review data from Natural Medicines Database - currently no safety or efficacy data available on Cognium in this resource. . Will provide educational Emmi handout: "How to Help Your Memory" . Counsel patient about COVID-19 prevention, including handwashing, social distancing and face masking as recommended by CDC. Marland Kitchen Counsel on blood pressure control and monitoring o [name] reports that she has Alvarez home upper arm monitor and checks blood pressure occasionally, but does not record results o Encourage patient to continue to check her blood pressure, but to also record results and bring log with her to PCP visits. . Will mail patient Alvarez home blood pressure log as requested, along with AHA handout on monitoring technique  Patient Self Care Activities:  . Self administers medications as prescribed . Attends all scheduled provider appointments . Calls pharmacy for medication refills . Calls provider office for new concerns or questions  Initial goal documentation        Plan:  The care management team will reach out to the patient again over the next 30 days.  The patient has been provided with  contact information for the care management team and has been advised to call with any health related questions or concerns.   Harlow Asa, PharmD, [place] [employer] [phone]

## 2019-01-17 ENCOUNTER — Other Ambulatory Visit: Payer: Self-pay | Admitting: Internal Medicine

## 2019-01-17 DIAGNOSIS — I10 Essential (primary) hypertension: Secondary | ICD-10-CM

## 2019-02-02 ENCOUNTER — Ambulatory Visit: Payer: Self-pay | Admitting: Pharmacist

## 2019-02-02 DIAGNOSIS — I10 Essential (primary) hypertension: Secondary | ICD-10-CM

## 2019-02-02 NOTE — Patient Instructions (Signed)
Thank you allowing the Chronic Care Management Team to be a part of your care! It was a pleasure speaking with you today!     CCM (Chronic Care Management) Team    Janci Minor RN, BSN Nurse Care Coordinator  [phone]   Harlow Asa PharmD  Clinical Pharmacist  [phone]   Eula Fried LCSW Clinical Social Worker [phone]  Visit Information  Goals Addressed            This Visit's Progress   . Medication Adherence       Current Barriers:  Marland Kitchen Knowledge Deficits related to hypertension disease state and monitoring  Pharmacist Clinical Goal(s):  Marland Kitchen Over the next 30 days, patient will work with CM Pharmacist to address needs related to medication regimen optimization  Interventions: . Counsel on blood pressure control and monitoring o [name] reports that she received the blood pressure log and education that I mailed. o Reports that she has been checking and recording her blood pressure, but is not currently home to provide results o Encourage patient to continue to check her blood pressure, record results and bring log with her to PCP visits. - Patient to call clinic sooner if needed for abnormal readings  Patient Self Care Activities:  . Self administers medications as prescribed . Attends all scheduled provider appointments . Calls pharmacy for medication refills . Calls provider office for new concerns or questions  Please see past updates related to this goal by clicking on the "Past Updates" button in the selected goal         The patient verbalized understanding of instructions provided today and declined a print copy of patient instruction materials.   The patient has been provided with contact information for the care management team and has been advised to call with any health related questions or concerns.   Harlow Asa, PharmD, [place] [employer] [phone]

## 2019-02-02 NOTE — Chronic Care Management (AMB) (Signed)
Chronic Care Management   Follow Up Note   02/02/2019 Name: Brittany Alvarez MRN: [id number] DOB: 05-26-1946  Referred by: Glean Hess, MD Reason for referral : Chronic Care Management (Patient Phone Call)   Brittany Alvarez is a 73 y.o. year old female who is a primary care patient of Army Melia Jesse Sans, MD. The CCM team was consulted for assistance with chronic disease management and care coordination needs. Brittany Alvarez has a past medical history including but not limited to hypertension, anxiety, hyperlipidemia, osteoporosis and Vitamin D deficiency.  I reached out to [employer] by phone today.   Review of patient status, including review of consultants reports, relevant laboratory and other test results, and collaboration with appropriate care team members and the patient's provider was performed as part of comprehensive patient evaluation and provision of chronic care management services.     Outpatient Encounter Medications as of 02/02/2019  Medication Sig Note  . alendronate (FOSAMAX) 70 MG tablet TAKE ONE TABLET BY MOUTH EACH WEEK, ON AN EMPTY STOMACH BEFORE BREAKFAST WITH 8oz OF WATER AND REMAIN UPRIGHT FOR :30 01/05/2019: On Sundays  . aspirin 81 MG tablet Take 81 mg by mouth daily.   . busPIRone (BUSPAR) 10 MG tablet Take one bid   . Cholecalciferol (VITAMIN D3 SUPER STRENGTH) 50 MCG (2000 UT) CAPS Take 1 capsule by mouth daily.   . furosemide (LASIX) 20 MG tablet TAKE 1 TABLET BY MOUTH ONCE DAILY   . lisinopril (ZESTRIL) 20 MG tablet TAKE 1 TABLET BY MOUTH ONCE DAILY   . loratadine (CLARITIN) 10 MG tablet Take 10 mg by mouth daily. otc 01/05/2019: Takes seasonally as needed  . lovastatin (MEVACOR) 20 MG tablet TAKE 2 TABLETS BY MOUTH AT BEDTIME AS DIRECTED   . Nutritional Supplements (NUTRITIONAL SUPPLEMENT PO) Take 1 capsule by mouth daily. Reports taking Cognium 100 mg once daily   . Probiotic Product (PROBIOTIC ADVANCED PO) Take by mouth.   Marland Kitchen tiZANidine (ZANAFLEX) 4 MG  tablet TAKE 1 TABLET BY MOUTH EVERY 8 HOURS AS NEEDED FOR MUSCLE SPASMS   . vitamin B-12 (CYANOCOBALAMIN) 100 MCG tablet Take 100 mcg by mouth daily.    No facility-administered encounter medications on file as of 02/02/2019.     Goals Addressed            This Visit's Progress   . Medication Adherence       Current Barriers:  Marland Kitchen Knowledge Deficits related to hypertension disease state and monitoring  Pharmacist Clinical Goal(s):  Marland Kitchen Over the next 30 days, patient will work with CM Pharmacist to address needs related to medication regimen optimization  Interventions: . Counsel on blood pressure control and monitoring o Ms. Hildebrant reports that she received the blood pressure log and education that I mailed. o Reports that she has been checking and recording her blood pressure, but is not currently home to provide results o Encourage patient to continue to check her blood pressure, record results and bring log with her to PCP visits. - Patient to call clinic sooner if needed for abnormal readings  Patient Self Care Activities:  . Self administers medications as prescribed . Attends all scheduled provider appointments . Calls pharmacy for medication refills . Calls provider office for new concerns or questions  Please see past updates related to this goal by clicking on the "Past Updates" button in the selected goal         Plan  1) Patient denies further medication questions/concerns at this  time. As patient [place] to the beach for [place]2 [place], advised that she would prefer to call when needed  2) The patient has been provided with contact information for the care management team and has been advised to call with any health related questions or concerns.   Harlow Asa, PharmD, [place] [employer] [phone]

## 2019-03-22 ENCOUNTER — Telehealth: Payer: Self-pay | Admitting: Internal Medicine

## 2019-03-22 NOTE — Telephone Encounter (Signed)
Called to schedule Medicare Annual Wellness Visit with Nurse Health Advisor, Kasey Uthus at [facility]. If patient returns call, please schedule AWV with [facility] ~ Any date on [facility] schedule (Mon or Wed)  °Questions regarding scheduling, please call  [phone] or Skype > [email or web address]  ° °Kathryn Brown  °Care Guide • [facility] °[facility]  °??[email or web address]   ??[phone]   1Skype ° ° °

## 2019-03-28 ENCOUNTER — Other Ambulatory Visit: Payer: Self-pay | Admitting: Internal Medicine

## 2019-03-31 ENCOUNTER — Other Ambulatory Visit: Payer: Self-pay

## 2019-03-31 ENCOUNTER — Other Ambulatory Visit
Admission: RE | Admit: 2019-03-31 | Discharge: 2019-03-31 | Disposition: A | Discharge status: Home / Self Care | Payer: PPO | Source: Ambulatory Visit | Attending: Internal Medicine | Admitting: Internal Medicine

## 2019-03-31 DIAGNOSIS — Z01812 Encounter for preprocedural laboratory examination: Secondary | ICD-10-CM | POA: Diagnosis not present

## 2019-03-31 DIAGNOSIS — Z20828 Contact with and (suspected) exposure to other viral communicable diseases: Secondary | ICD-10-CM | POA: Insufficient documentation

## 2019-03-31 LAB — SARS CORONAVIRUS 2 (TAT 6-24 HRS): SARS Coronavirus 2: NEGATIVE

## 2019-04-04 ENCOUNTER — Other Ambulatory Visit: Payer: Self-pay

## 2019-04-04 ENCOUNTER — Ambulatory Visit
Admission: RE | Admit: 2019-04-04 | Discharge: 2019-04-04 | Disposition: A | Discharge status: Home / Self Care | Payer: PPO | Attending: Internal Medicine | Admitting: Internal Medicine

## 2019-04-04 ENCOUNTER — Ambulatory Visit: Payer: PPO | Admitting: Anesthesiology

## 2019-04-04 ENCOUNTER — Encounter
Admission: RE | Disposition: A | Discharge status: Home / Self Care | Payer: Self-pay | Source: Home / Self Care | Attending: Internal Medicine

## 2019-04-04 DIAGNOSIS — K640 First degree hemorrhoids: Secondary | ICD-10-CM | POA: Insufficient documentation

## 2019-04-04 DIAGNOSIS — E785 Hyperlipidemia, unspecified: Secondary | ICD-10-CM | POA: Diagnosis not present

## 2019-04-04 DIAGNOSIS — Z79899 Other long term (current) drug therapy: Secondary | ICD-10-CM | POA: Insufficient documentation

## 2019-04-04 DIAGNOSIS — Z7982 Long term (current) use of aspirin: Secondary | ICD-10-CM | POA: Diagnosis not present

## 2019-04-04 DIAGNOSIS — Z888 Allergy status to other drugs, medicaments and biological substances status: Secondary | ICD-10-CM | POA: Diagnosis not present

## 2019-04-04 DIAGNOSIS — Z91040 Latex allergy status: Secondary | ICD-10-CM | POA: Insufficient documentation

## 2019-04-04 DIAGNOSIS — K5790 Diverticulosis of intestine, part unspecified, without perforation or abscess without bleeding: Secondary | ICD-10-CM | POA: Diagnosis not present

## 2019-04-04 DIAGNOSIS — Z883 Allergy status to other anti-infective agents status: Secondary | ICD-10-CM | POA: Diagnosis not present

## 2019-04-04 DIAGNOSIS — I10 Essential (primary) hypertension: Secondary | ICD-10-CM | POA: Diagnosis not present

## 2019-04-04 DIAGNOSIS — Z1211 Encounter for screening for malignant neoplasm of colon: Secondary | ICD-10-CM | POA: Diagnosis not present

## 2019-04-04 DIAGNOSIS — K648 Other hemorrhoids: Secondary | ICD-10-CM | POA: Diagnosis not present

## 2019-04-04 DIAGNOSIS — K5730 Diverticulosis of large intestine without perforation or abscess without bleeding: Secondary | ICD-10-CM | POA: Diagnosis not present

## 2019-04-04 DIAGNOSIS — Z86010 Personal history of colonic polyps: Secondary | ICD-10-CM | POA: Diagnosis not present

## 2019-04-04 HISTORY — PX: COLONOSCOPY WITH PROPOFOL: SHX5780

## 2019-04-04 SURGERY — COLONOSCOPY WITH PROPOFOL
Anesthesia: General

## 2019-04-04 MED ORDER — LIDOCAINE HCL (PF) 2 % IJ SOLN
INTRAMUSCULAR | Status: DC | PRN
  Administered 2019-04-04: 50 mg

## 2019-04-04 MED ORDER — FENTANYL CITRATE (PF) 100 MCG/2ML IJ SOLN
INTRAMUSCULAR | Status: DC | PRN
  Administered 2019-04-04 (×4): 25 ug via INTRAVENOUS

## 2019-04-04 MED ORDER — PROPOFOL 500 MG/50ML IV EMUL
INTRAVENOUS | Status: DC | PRN
  Administered 2019-04-04: 50 ug/kg/min via INTRAVENOUS

## 2019-04-04 MED ORDER — PROPOFOL 500 MG/50ML IV EMUL
INTRAVENOUS | Status: AC
  Filled 2019-04-04: qty 50

## 2019-04-04 MED ORDER — MIDAZOLAM HCL 5 MG/5ML IJ SOLN
INTRAMUSCULAR | Status: DC | PRN
  Administered 2019-04-04: 2 mg via INTRAVENOUS

## 2019-04-04 MED ORDER — PROPOFOL 10 MG/ML IV BOLUS
INTRAVENOUS | Status: DC | PRN
  Administered 2019-04-04: 10 mg via INTRAVENOUS
  Administered 2019-04-04 (×2): 20 mg via INTRAVENOUS

## 2019-04-04 MED ORDER — LIDOCAINE HCL (PF) 2 % IJ SOLN
INTRAMUSCULAR | Status: AC
  Filled 2019-04-04: qty 10

## 2019-04-04 MED ORDER — FENTANYL CITRATE (PF) 100 MCG/2ML IJ SOLN
INTRAMUSCULAR | Status: AC
  Filled 2019-04-04: qty 2

## 2019-04-04 MED ORDER — MIDAZOLAM HCL 2 MG/2ML IJ SOLN
INTRAMUSCULAR | Status: AC
  Filled 2019-04-04: qty 2

## 2019-04-04 MED ORDER — SODIUM CHLORIDE 0.9 % IV SOLN
INTRAVENOUS | Status: DC
  Administered 2019-04-04: 1000 mL via INTRAVENOUS

## 2019-04-04 NOTE — Anesthesia Post-op Follow-up Note (Signed)
Anesthesia QCDR form completed.        

## 2019-04-04 NOTE — Anesthesia Preprocedure Evaluation (Signed)
Anesthesia Evaluation  Patient identified by MRN, date of birth, ID band Patient awake    Reviewed: Allergy & Precautions, NPO status , Patient's Chart, lab work & pertinent test results  History of Anesthesia Complications Negative for: history of anesthetic complications  Airway Mallampati: II       Dental   Pulmonary neg sleep apnea, neg COPD, Not current smoker,           Cardiovascular hypertension, Pt. on medications (-) Past MI and (-) CHF (-) dysrhythmias (-) Valvular Problems/Murmurs     Neuro/Psych neg Seizures Anxiety    GI/Hepatic Neg liver ROS, GERD  ,  Endo/Other  neg diabetes  Renal/GU negative Renal ROS     Musculoskeletal   Abdominal   Peds  Hematology  (+) anemia ,   Anesthesia Other Findings   Reproductive/Obstetrics                             Anesthesia Physical Anesthesia Plan  ASA: II  Anesthesia Plan: General   Post-op Pain Management:    Induction: Intravenous  PONV Risk Score and Plan: 3 and Propofol infusion, TIVA and Treatment may vary due to age or medical condition  Airway Management Planned: Nasal Cannula  Additional Equipment:   Intra-op Plan:   Post-operative Plan:   Informed Consent: I have reviewed the patients History and Physical, chart, labs and discussed the procedure including the risks, benefits and alternatives for the proposed anesthesia with the patient or authorized representative who has indicated his/her understanding and acceptance.       Plan Discussed with:   Anesthesia Plan Comments:         Anesthesia Quick Evaluation

## 2019-04-04 NOTE — Transfer of Care (Signed)
Immediate Anesthesia Transfer of Care Note  Patient: [place]  Procedure(s) Performed: COLONOSCOPY WITH PROPOFOL (N/A )  Patient Location: PACU  Anesthesia Type:General  Level of Consciousness: sedated  Airway & Oxygen Therapy: Patient Spontanous Breathing and Patient connected to nasal cannula oxygen  Post-op Assessment: Report given to RN and Post -op Vital signs reviewed and stable  Post vital signs: Reviewed and stable  Last Vitals:  Vitals Value Taken Time  BP    Temp    Pulse 85 04/04/19 0839  Resp 21 04/04/19 0839  SpO2 100 % 04/04/19 0839  Vitals shown include unvalidated device data.  Last Pain:  Vitals:   04/04/19 0707  TempSrc: Tympanic  PainSc: 0-No pain         Complications: No apparent anesthesia complications

## 2019-04-04 NOTE — H&P (Signed)
Outpatient short stay form Pre-procedure 04/04/2019 8:18 AM Brittany Alvarez K. Alice Reichert, M.D.  Primary Physician: Halina Maidens, M.D.  Reason for visit:  Personal hx of colon polyps.  History of present illness:                            Patient presents for colonoscopy for a personal hx of colon polyps. The patient denies abdominal pain, abnormal weight loss or rectal bleeding.     Current Facility-Administered Medications:  .  0.9 %  sodium chloride infusion, , Intravenous, Continuous, [place], Benay Pike, MD, Last Rate: 20 mL/hr at 04/04/19 [id number], 1,000 mL at 04/04/19 [id number]  Medications Prior to Admission  Medication Sig Dispense Refill Last Dose  . alendronate (FOSAMAX) 70 MG tablet TAKE ONE TABLET BY MOUTH EACH WEEK, ON AN EMPTY STOMACH BEFORE BREAKFAST WITH 8oz OF WATER AND REMAIN UPRIGHT FOR :30 4 tablet 12 04/03/2019 at Unknown time  . aspirin 81 MG tablet Take 81 mg by mouth daily.   Past Week at Unknown time  . busPIRone (BUSPAR) 10 MG tablet Take one bid 60 tablet 5 04/03/2019 at Unknown time  . Cholecalciferol (VITAMIN D3 SUPER STRENGTH) 50 MCG (2000 UT) CAPS Take 1 capsule by mouth daily.   Past Week at Unknown time  . furosemide (LASIX) 20 MG tablet TAKE 1 TABLET BY MOUTH ONCE DAILY 30 tablet 12 Past Week at Unknown time  . lisinopril (ZESTRIL) 20 MG tablet TAKE 1 TABLET BY MOUTH ONCE DAILY 30 tablet 5 04/04/2019 at 0530  . loratadine (CLARITIN) 10 MG tablet Take 10 mg by mouth daily. otc   Past Week at Unknown time  . lovastatin (MEVACOR) 20 MG tablet TAKE 2 TABLETS BY MOUTH AT BEDTIME AS DIRECTED 60 tablet 12 04/03/2019 at Unknown time  . Nutritional Supplements (NUTRITIONAL SUPPLEMENT PO) Take 1 capsule by mouth daily. Reports taking Cognium 100 mg once daily   Past Week at Unknown time  . Probiotic Product (PROBIOTIC ADVANCED PO) Take by mouth.   Past Week at Unknown time  . tiZANidine (ZANAFLEX) 4 MG tablet TAKE 1 TABLET BY MOUTH EVERY 8 HOURS AS NEEDED FOR MUSCLE SPASMS 90 tablet 12  04/03/2019 at Unknown time  . vitamin B-12 (CYANOCOBALAMIN) 100 MCG tablet Take 100 mcg by mouth daily.   Past Week at Unknown time     Allergies  Allergen Reactions  . Povidone Iodine Rash  . Latex Hives    Patient states painful and rash  . Thiazide-Type Diuretics     Hypercalcemia  . Diflucan [Fluconazole] Rash    Possible rash that occurred one week after completing one week of diflucan     Past Medical History:  Diagnosis Date  . Anemia   . Anxiety   . Blood transfusion    approx. 40 years ago  . Gallstones    hx of  . GERD (gastroesophageal reflux disease)   . Hyperlipidemia   . Hypertension    sees Dr. Army Melia in [place], [place]    Review of systems:  Otherwise negative.    Physical Exam  Gen: Alert, oriented. Appears stated age.  HEENT: [place]/AT. PERRLA. Lungs: CTA, no wheezes. CV: RR nl S1, S2. Abd: soft, benign, no masses. BS+ Ext: No edema. Pulses 2+    Planned procedures: Proceed with colonoscopy. The patient understands the nature of the planned procedure, indications, risks, alternatives and potential complications including but not limited to bleeding, infection, perforation, damage to internal organs  and possible oversedation/side effects from anesthesia. The patient agrees and gives consent to proceed.  Please refer to procedure notes for findings, recommendations and patient disposition/instructions.     Brittany Alvarez K. Alice Reichert, M.D. Gastroenterology 04/04/2019  8:18 AM

## 2019-04-04 NOTE — Op Note (Signed)
[facility] Gastroenterology Patient Name: Brittany Alvarez Procedure Date: 04/04/2019 8:17 AM MRN: [id number] Account #: [id number] Date of Birth: [birth year] Admit Type: Outpatient Age: 73 Room: [facility] ENDO ROOM 3 Gender: Female Note Status: Finalized Procedure:             Colonoscopy Indications:           High risk colon cancer surveillance: Personal history                         of colonic polyps Providers:             Benay Pike. Alice Reichert MD, MD Referring MD:          Halina Maidens, MD (Referring MD) Medicines:             Propofol per Anesthesia Complications:         No immediate complications. Procedure:             Pre-Anesthesia Assessment:                        - The risks and benefits of the procedure and the                         sedation options and risks were discussed with the                         patient. All questions were answered and informed                         consent was obtained.                        - Patient identification and proposed procedure were                         verified prior to the procedure by the nurse. The                         procedure was verified in the procedure room.                        - ASA Grade Assessment: III - A patient with severe                         systemic disease.                        - After reviewing the risks and benefits, the patient                         was deemed in satisfactory condition to undergo the                         procedure.                        After obtaining informed consent, the colonoscope was                         passed under direct vision. Throughout the procedure,  the patient's blood pressure, pulse, and oxygen                         saturations were monitored continuously. The                         Colonoscope was introduced through the anus and                         advanced to the the cecum, identified by appendiceal                       orifice and ileocecal valve. The colonoscopy was                         performed without difficulty. The patient tolerated                         the procedure well. The quality of the bowel                         preparation was excellent. The ileocecal valve,                         appendiceal orifice, and rectum were photographed. Findings:      The perianal and digital rectal examinations were normal. Pertinent       negatives include normal sphincter tone and no palpable rectal lesions.      A few medium-mouthed diverticula were found in the sigmoid colon.      Non-bleeding internal hemorrhoids were found during retroflexion. The       hemorrhoids were Grade I (internal hemorrhoids that do not prolapse).      Non-bleeding internal hemorrhoids were found during retroflexion. The       hemorrhoids were Grade I (internal hemorrhoids that do not prolapse).      The exam was otherwise without abnormality. Impression:            - Diverticulosis in the sigmoid colon.                        - Non-bleeding internal hemorrhoids.                        - Non-bleeding internal hemorrhoids.                        - No specimens collected. Recommendation:        - Patient has a contact number available for                         emergencies. The signs and symptoms of potential                         delayed complications were discussed with the patient.                         Return to normal activities tomorrow. Written                         discharge instructions were provided  to the patient.                        - Resume previous diet.                        - Continue present medications.                        - Repeat colonoscopy in 5 years for surveillance.                        - Return to GI office PRN.                        - The findings and recommendations were discussed with                         the patient. Procedure Code(s):     ---  Professional ---                        [id number], Colorectal cancer screening; colonoscopy on                         individual at high risk Diagnosis Code(s):     --- Professional ---                        K57.30, Diverticulosis of large intestine without                         perforation or abscess without bleeding                        K64.0, First degree hemorrhoids                        Z86.010, Personal history of colonic polyps CPT copyright 2019 American Medical Association. All rights reserved. The codes documented in this report are preliminary and upon coder review may  be revised to meet current compliance requirements. Efrain Sella MD, MD 04/04/2019 8:41:52 AM This report has been signed electronically. Number of Addenda: 0 Note Initiated On: 04/04/2019 8:17 AM Scope Withdrawal Time: 0 hours 6 minutes 14 seconds  Total Procedure Duration: 0 hours 11 minutes 43 seconds  Estimated Blood Loss:  Estimated blood loss: none. Estimated blood loss: none.      [facility]

## 2019-04-04 NOTE — Anesthesia Postprocedure Evaluation (Signed)
Anesthesia Post Note  Patient: Brittany Alvarez  Procedure(s) Performed: COLONOSCOPY WITH PROPOFOL (N/A )  Patient location during evaluation: Endoscopy Anesthesia Type: General Level of consciousness: awake and alert Pain management: pain level controlled Vital Signs Assessment: post-procedure vital signs reviewed and stable Respiratory status: spontaneous breathing and respiratory function stable Cardiovascular status: stable Anesthetic complications: no     Last Vitals:  Vitals:   04/04/19 0707 04/04/19 0839  BP: 139/73 (!) 116/56  Pulse: (!) 105 85  Resp: 20 (!) 21  Temp: (!) 35.9 C 36.9 C  SpO2: 100% 100%    Last Pain:  Vitals:   04/04/19 0839  TempSrc: Temporal  PainSc:                  KEPHART,WILLIAM K

## 2019-04-04 NOTE — Interval H&P Note (Signed)
History and Physical Interval Note:  04/04/2019 8:19 AM  Brittany Alvarez  has presented today for surgery, with the diagnosis of Hx Adeno Polyps.  The various methods of treatment have been discussed with the patient and family. After consideration of risks, benefits and other options for treatment, the patient has consented to  Procedure(s): COLONOSCOPY WITH PROPOFOL (N/A) as a surgical intervention.  The patient's history has been reviewed, patient examined, no change in status, stable for surgery.  I have reviewed the patient's chart and labs.  Questions were answered to the patient's satisfaction.     [place], [place]

## 2019-04-05 ENCOUNTER — Encounter: Payer: Self-pay | Admitting: Internal Medicine

## 2019-04-11 ENCOUNTER — Other Ambulatory Visit: Payer: Self-pay | Admitting: Internal Medicine

## 2019-04-11 DIAGNOSIS — E785 Hyperlipidemia, unspecified: Secondary | ICD-10-CM

## 2019-04-15 DIAGNOSIS — H40003 Preglaucoma, unspecified, bilateral: Secondary | ICD-10-CM | POA: Diagnosis not present

## 2019-04-15 DIAGNOSIS — H2513 Age-related nuclear cataract, bilateral: Secondary | ICD-10-CM | POA: Diagnosis not present

## 2019-04-20 ENCOUNTER — Other Ambulatory Visit: Payer: Self-pay

## 2019-04-20 ENCOUNTER — Ambulatory Visit (INDEPENDENT_AMBULATORY_CARE_PROVIDER_SITE_OTHER): Payer: PPO | Admitting: Internal Medicine

## 2019-04-20 ENCOUNTER — Encounter: Payer: Self-pay | Admitting: Internal Medicine

## 2019-04-20 VITALS — BP 124/80 | HR 68 | Ht 60.0 in | Wt 122.0 lb

## 2019-04-20 DIAGNOSIS — Z0000 Encounter for general adult medical examination without abnormal findings: Secondary | ICD-10-CM

## 2019-04-20 DIAGNOSIS — I10 Essential (primary) hypertension: Secondary | ICD-10-CM | POA: Diagnosis not present

## 2019-04-20 DIAGNOSIS — E559 Vitamin D deficiency, unspecified: Secondary | ICD-10-CM | POA: Diagnosis not present

## 2019-04-20 DIAGNOSIS — Z23 Encounter for immunization: Secondary | ICD-10-CM

## 2019-04-20 DIAGNOSIS — Z1231 Encounter for screening mammogram for malignant neoplasm of breast: Secondary | ICD-10-CM | POA: Diagnosis not present

## 2019-04-20 DIAGNOSIS — E785 Hyperlipidemia, unspecified: Secondary | ICD-10-CM | POA: Diagnosis not present

## 2019-04-20 DIAGNOSIS — M810 Age-related osteoporosis without current pathological fracture: Secondary | ICD-10-CM

## 2019-04-20 LAB — POCT URINALYSIS DIPSTICK
Bilirubin, UA: NEGATIVE
Blood, UA: NEGATIVE
Glucose, UA: NEGATIVE
Ketones, UA: NEGATIVE
Leukocytes, UA: NEGATIVE
Nitrite, UA: NEGATIVE
Protein, UA: NEGATIVE
Spec Grav, UA: 1.01 (ref 1.010–1.025)
Urobilinogen, UA: 0.2 E.U./dL
pH, UA: 5 (ref 5.0–8.0)

## 2019-04-20 MED ORDER — SHINGRIX 50 MCG/0.5ML IM SUSR: 0.5000 mL | Freq: Once | INTRAMUSCULAR | 1 refills | Status: AC

## 2019-04-20 NOTE — Progress Notes (Signed)
Date:  04/20/2019   Name:  Brittany Alvarez   DOB:  April 22, 1946   MRN:  [id number]   Chief Complaint: Annual Exam (Breast Exam. Rash under left breast. Feels like heat rash. ) Brittany Alvarez is a 73 y.o. female who presents today for her Complete Annual Exam. She feels well. She reports exercising yard work and housework. She reports she is sleeping well. She denies breast issues.  Mammogram 05/2018 DEXA  05/2018 Colonoscopy  03/2019 Immunization History  Administered Date(s) Administered  . Influenza, High Dose Seasonal PF 03/18/2017, 02/15/2018, 01/24/2019  . Influenza-Unspecified 01/24/2014, 02/25/2015, 03/16/2017  . Pneumococcal Conjugate-13 04/13/2015  . Pneumococcal Polysaccharide-23 01/24/2013  . Tdap 04/15/2016  . Zoster 05/27/2009    Hypertension This is a chronic problem. The problem is controlled. Pertinent negatives include no chest pain, headaches, palpitations or shortness of breath. Past treatments include ACE inhibitors and diuretics. The current treatment provides significant improvement. There are no compliance problems.   Hyperlipidemia The problem is controlled. Pertinent negatives include no chest pain or shortness of breath. Current antihyperlipidemic treatment includes statins. The current treatment provides significant improvement of lipids. There are no compliance problems.     Lab Results  Component Value Date   CREATININE 1.08 (H) 04/16/2018   BUN 30 (H) 04/16/2018   NA 141 04/16/2018   K 4.5 04/16/2018   CL 101 04/16/2018   CO2 24 04/16/2018   Lab Results  Component Value Date   CHOL 221 (H) 04/16/2018   HDL 86 04/16/2018   LDLCALC 118 (H) 04/16/2018   TRIG 86 04/16/2018   CHOLHDL 2.6 04/16/2018   Lab Results  Component Value Date   TSH 2.130 04/16/2018   No results found for: HGBA1C   Review of Systems  Constitutional: Negative for chills, fatigue and fever.  HENT: Negative for congestion, hearing loss, tinnitus, trouble swallowing and  voice change.   Eyes: Negative for visual disturbance.  Respiratory: Negative for cough, chest tightness, shortness of breath and wheezing.   Cardiovascular: Negative for chest pain, palpitations and leg swelling.  Gastrointestinal: Negative for abdominal pain, constipation, diarrhea and vomiting.  Endocrine: Negative for polydipsia and polyuria.  Genitourinary: Negative for dysuria, frequency, genital sores, vaginal bleeding and vaginal discharge.  Musculoskeletal: Negative for arthralgias, gait problem and joint swelling.  Skin: Positive for rash. Negative for color change.  Neurological: Negative for dizziness, tremors, light-headedness and headaches.  Hematological: Negative for adenopathy. Does not bruise/bleed easily.  Psychiatric/Behavioral: Negative for dysphoric mood and sleep disturbance. The patient is not nervous/anxious.     Patient Active Problem List   Diagnosis Date Noted  . Vitamin D deficiency 04/17/2018  . Deaf 04/13/2015  . Generalized anxiety disorder 03/04/2015  . Benign colonic polyp 03/04/2015  . Degeneration of intervertebral disc of lumbar region 03/04/2015  . Dyslipidemia 03/04/2015  . Allergic state 03/04/2015  . Essential (primary) hypertension 03/04/2015  . Calcium blood increased 03/04/2015  . Arthritis of hand, degenerative 03/04/2015  . Osteoporosis without current pathological fracture 03/04/2015  . History of benign schwannoma 03/04/2015    Allergies  Allergen Reactions  . Povidone Iodine Rash  . Latex Hives    Patient states painful and rash  . Metolazone Other (See Comments)    Hypercalcemia  . Thiazide-Type Diuretics     Hypercalcemia  . Diflucan [Fluconazole] Rash    Possible rash that occurred one week after completing one week of diflucan    Past Surgical History:  Procedure Laterality Date  .  ANTERIOR LAT LUMBAR FUSION  06/24/2011   Procedure: ANTERIOR LATERAL LUMBAR FUSION 1 LEVEL;  Surgeon: Faythe Ghee, MD;  Location: [facility];  Service: Neurosurgery;  Laterality: Right;  Right Lumbar Three-Four Anterior Lateral Lumbar Interbody Fusion with Right Lumbar Three-Four percutaneous pedicle screws (Lateral Postion for both procedures)  . APPENDECTOMY    . BACK SURGERY     x 2  . BREAST CYST ASPIRATION Left 1990   NEG  . BREAST EXCISIONAL BIOPSY Left age 40's   NEG  . CHOLECYSTECTOMY    . COLONOSCOPY WITH PROPOFOL N/A 01/15/2016   Procedure: COLONOSCOPY WITH PROPOFOL;  Surgeon: Lollie Sails, MD;  Location: [facility] ENDOSCOPY;  Service: Endoscopy;  Laterality: N/A;  . COLONOSCOPY WITH PROPOFOL N/A 04/04/2019   Procedure: COLONOSCOPY WITH PROPOFOL;  Surgeon: Toledo, Benay Pike, MD;  Location: [facility] ENDOSCOPY;  Service: Gastroenterology;  Laterality: N/A;  . OVARY SURGERY     removal of left and left tube  . TONSILLECTOMY    . vestibular tumor resection Right 2016   schwannoma    Social History   Tobacco Use  . Smoking status: Never Smoker  . Smokeless tobacco: Never Used  . Tobacco comment: smoking cessation materials not required  Substance Use Topics  . Alcohol use: Yes    Alcohol/week: 0.0 standard drinks    Comment: occ  . Drug use: No     Medication list has been reviewed and updated.  Current Meds  Medication Sig  . alendronate (FOSAMAX) 70 MG tablet TAKE ONE TABLET BY MOUTH EACH WEEK, ON AN EMPTY STOMACH BEFORE BREAKFAST WITH 8oz OF WATER AND REMAIN UPRIGHT FOR :30  . aspirin 81 MG tablet Take 81 mg by mouth daily.  . busPIRone (BUSPAR) 10 MG tablet Take one bid  . Cholecalciferol (VITAMIN D3 SUPER STRENGTH) 50 MCG (2000 UT) CAPS Take 1 capsule by mouth daily.  . furosemide (LASIX) 20 MG tablet TAKE 1 TABLET BY MOUTH ONCE DAILY  . lisinopril (ZESTRIL) 20 MG tablet TAKE 1 TABLET BY MOUTH ONCE DAILY  . loratadine (CLARITIN) 10 MG tablet Take 10 mg by mouth daily. otc  . lovastatin (MEVACOR) 20 MG tablet TAKE 2 TABLETS BY MOUTH AT BEDTIME AS DIRECTED  . NON FORMULARY Take by mouth.  .  Nutritional Supplements (NUTRITIONAL SUPPLEMENT PO) Take 1 capsule by mouth daily. Reports taking Cognium 100 mg once daily  . Probiotic Product (PROBIOTIC ADVANCED PO) Take by mouth.  Marland Kitchen tiZANidine (ZANAFLEX) 4 MG tablet TAKE 1 TABLET BY MOUTH EVERY 8 HOURS AS NEEDED FOR MUSCLE SPASMS  . vitamin B-12 (CYANOCOBALAMIN) 100 MCG tablet Take 100 mcg by mouth daily.    PHQ 2/9 Scores 04/20/2019 10/15/2018 04/16/2018 01/11/2018  PHQ - 2 Score 2 2 1  0  PHQ- 9 Score 4 5 - 0    BP Readings from Last 3 Encounters:  04/20/19 124/80  04/04/19 128/70  10/15/18 122/84    Physical Exam Vitals signs and nursing note reviewed.  Constitutional:      General: She is not in acute distress.    Appearance: She is well-developed.  HENT:     Head: Normocephalic and atraumatic.     Right Ear: Tympanic membrane and ear canal normal.     Left Ear: Tympanic membrane and ear canal normal.     Nose:     Right Sinus: No maxillary sinus tenderness.     Left Sinus: No maxillary sinus tenderness.  Eyes:     General: No scleral icterus.  Right eye: No discharge.        Left eye: No discharge.     Conjunctiva/sclera: Conjunctivae normal.  Neck:     Musculoskeletal: Normal range of motion. No erythema.     Thyroid: No thyromegaly.     Vascular: No carotid bruit.  Cardiovascular:     Rate and Rhythm: Normal rate and regular rhythm.     Pulses: Normal pulses.     Heart sounds: Normal heart sounds.  Pulmonary:     Effort: Pulmonary effort is normal. No respiratory distress.     Breath sounds: No wheezing.  Chest:     Breasts:        Right: No mass, nipple discharge, skin change or tenderness.        Left: No mass, nipple discharge, skin change or tenderness.  Abdominal:     General: Bowel sounds are normal.     Palpations: Abdomen is soft.     Tenderness: There is no abdominal tenderness.  Musculoskeletal: Normal range of motion.  Lymphadenopathy:     Cervical: No cervical adenopathy.  Skin:     General: Skin is warm and dry.     Findings: No rash (red rash under left breast).  Neurological:     Mental Status: She is alert and oriented to person, place, and time.     Cranial Nerves: No cranial nerve deficit.     Sensory: No sensory deficit.     Deep Tendon Reflexes: Reflexes are normal and symmetric.  Psychiatric:        Speech: Speech normal.        Behavior: Behavior normal.        Thought Content: Thought content normal.     Wt Readings from Last 3 Encounters:  04/20/19 122 lb (55.3 kg)  04/04/19 118 lb (53.5 kg)  10/15/18 121 lb (54.9 kg)    BP 124/80   Pulse 68   Ht 5' (1.524 m)   Wt 122 lb (55.3 kg)   SpO2 99%   BMI 23.83 kg/m   Assessment and Plan: 1. Annual physical exam Normal exam Continue healthy diet, regular exercise - POCT urinalysis dipstick  2. Encounter for screening mammogram for breast cancer - MM 3D SCREEN BREAST BILATERAL; Future  3. Essential (primary) hypertension Clinically stable exam with well controlled BP.   Tolerating medications, lisinopril 20 mg and lasix 20 mg, without side effects at this time. Pt to continue current regimen and low sodium diet; benefits of regular exercise as able discussed. - CBC with Differential/Platelet - Comprehensive metabolic panel - TSH  4. Dyslipidemia Tolerating statin medication without side effects at this time Continue same therapy without change at this time. - Lipid panel  5. Vitamin D deficiency Continue vitamin D supplementation; will check level and advise of dose adjustemtn - Vitamin D (25 hydroxy)  6. Osteoporosis without current pathological fracture, unspecified osteoporosis type Tolerating Fosamax well without side effects Continue vitamin D and regular exercise Will repeat DEXA in one year  7. Need for shingles vaccine - Zoster Vaccine Adjuvanted [facility]) injection; Inject 0.5 mLs into the muscle once for 1 dose.  Dispense: 0.5 mL; Refill: 1   Partially dictated using  Editor, commissioning. Any errors are unintentional.  Halina Maidens, MD [facility]  04/20/2019

## 2019-04-21 LAB — COMPREHENSIVE METABOLIC PANEL
ALT: 11 IU/L (ref 0–32)
AST: 22 IU/L (ref 0–40)
Albumin/Globulin Ratio: 2 (ref 1.2–2.2)
Albumin: 4.7 g/dL (ref 3.7–4.7)
Alkaline Phosphatase: 71 IU/L (ref 39–117)
BUN/Creatinine Ratio: 20 (ref 12–28)
BUN: 20 mg/dL (ref 8–27)
Bilirubin Total: 0.5 mg/dL (ref 0.0–1.2)
CO2: 23 mmol/L (ref 20–29)
Calcium: 9.7 mg/dL (ref 8.7–10.3)
Chloride: 97 mmol/L (ref 96–106)
Creatinine, Ser: 1 mg/dL (ref 0.57–1.00)
GFR calc Af Amer: 65 mL/min/{1.73_m2} (ref >59)
GFR calc non Af Amer: 57 mL/min/{1.73_m2} — ABNORMAL LOW (ref >59)
Globulin, Total: 2.3 g/dL (ref 1.5–4.5)
Glucose: 91 mg/dL (ref 65–99)
Potassium: 4.6 mmol/L (ref 3.5–5.2)
Sodium: 135 mmol/L (ref 134–144)
Total Protein: 7 g/dL (ref 6.0–8.5)

## 2019-04-21 LAB — CBC WITH DIFFERENTIAL/PLATELET
Basophils Absolute: 0.1 10*3/uL (ref 0.0–0.2)
Basos: 1 %
EOS (ABSOLUTE): 0.1 10*3/uL (ref 0.0–0.4)
Eos: 1 %
Hematocrit: 39.7 % (ref 34.0–46.6)
Hemoglobin: 13.3 g/dL (ref 11.1–15.9)
Immature Grans (Abs): 0 10*3/uL (ref 0.0–0.1)
Immature Granulocytes: 0 %
Lymphocytes Absolute: 2.2 10*3/uL (ref 0.7–3.1)
Lymphs: 32 %
MCH: 27.5 pg (ref 26.6–33.0)
MCHC: 33.5 g/dL (ref 31.5–35.7)
MCV: 82 fL (ref 79–97)
Monocytes Absolute: 0.5 10*3/uL (ref 0.1–0.9)
Monocytes: 7 %
Neutrophils Absolute: 4 10*3/uL (ref 1.4–7.0)
Neutrophils: 59 %
Platelets: 365 10*3/uL (ref 150–450)
RBC: 4.84 x10E6/uL (ref 3.77–5.28)
RDW: 12.7 % (ref 11.7–15.4)
WBC: 6.9 10*3/uL (ref 3.4–10.8)

## 2019-04-21 LAB — LIPID PANEL
Chol/HDL Ratio: 2.4 ratio (ref 0.0–4.4)
Cholesterol, Total: 190 mg/dL (ref 100–199)
HDL: 78 mg/dL (ref >39)
LDL Chol Calc (NIH): 97 mg/dL (ref 0–99)
Triglycerides: 81 mg/dL (ref 0–149)
VLDL Cholesterol Cal: 15 mg/dL (ref 5–40)

## 2019-04-21 LAB — VITAMIN D 25 HYDROXY (VIT D DEFICIENCY, FRACTURES): Vit D, 25-Hydroxy: 37 ng/mL (ref 30.0–100.0)

## 2019-04-21 LAB — TSH: TSH: 1.91 u[IU]/mL (ref 0.450–4.500)

## 2019-04-25 ENCOUNTER — Other Ambulatory Visit: Payer: Self-pay | Admitting: Internal Medicine

## 2019-04-25 DIAGNOSIS — I10 Essential (primary) hypertension: Secondary | ICD-10-CM

## 2019-04-26 DIAGNOSIS — H2512 Age-related nuclear cataract, left eye: Secondary | ICD-10-CM | POA: Diagnosis not present

## 2019-04-26 DIAGNOSIS — I10 Essential (primary) hypertension: Secondary | ICD-10-CM | POA: Diagnosis not present

## 2019-05-10 ENCOUNTER — Other Ambulatory Visit: Payer: Self-pay

## 2019-05-10 ENCOUNTER — Encounter: Payer: Self-pay | Admitting: Ophthalmology

## 2019-05-11 ENCOUNTER — Encounter: Payer: Self-pay | Admitting: Anesthesiology

## 2019-05-12 ENCOUNTER — Other Ambulatory Visit
Admission: RE | Admit: 2019-05-12 | Discharge: 2019-05-12 | Disposition: A | Discharge status: Home / Self Care | Payer: PPO | Source: Ambulatory Visit | Attending: Ophthalmology | Admitting: Ophthalmology

## 2019-05-12 ENCOUNTER — Other Ambulatory Visit: Payer: Self-pay

## 2019-05-12 DIAGNOSIS — Z01812 Encounter for preprocedural laboratory examination: Secondary | ICD-10-CM | POA: Diagnosis not present

## 2019-05-12 DIAGNOSIS — Z20828 Contact with and (suspected) exposure to other viral communicable diseases: Secondary | ICD-10-CM | POA: Diagnosis not present

## 2019-05-12 LAB — SARS CORONAVIRUS 2 (TAT 6-24 HRS): SARS Coronavirus 2: NEGATIVE

## 2019-05-12 NOTE — Discharge Instructions (Signed)

## 2019-05-16 ENCOUNTER — Ambulatory Visit: Admission: RE | Admit: 2019-05-16 | Payer: PPO | Source: Home / Self Care | Admitting: Ophthalmology

## 2019-05-16 HISTORY — DX: Unspecified hearing loss, right ear: H91.91

## 2019-05-16 HISTORY — DX: Unspecified osteoarthritis, unspecified site: M19.90

## 2019-05-16 SURGERY — PHACOEMULSIFICATION, CATARACT, WITH IOL INSERTION
Anesthesia: Topical | Laterality: Left

## 2019-06-12 ENCOUNTER — Other Ambulatory Visit: Payer: Self-pay | Admitting: Internal Medicine

## 2019-06-12 DIAGNOSIS — F411 Generalized anxiety disorder: Secondary | ICD-10-CM

## 2019-06-20 ENCOUNTER — Other Ambulatory Visit: Payer: Self-pay | Admitting: Internal Medicine

## 2019-06-20 DIAGNOSIS — I10 Essential (primary) hypertension: Secondary | ICD-10-CM

## 2019-07-05 ENCOUNTER — Ambulatory Visit
Admission: RE | Admit: 2019-07-05 | Discharge: 2019-07-05 | Disposition: A | Discharge status: Home / Self Care | Payer: PPO | Source: Ambulatory Visit | Attending: Internal Medicine | Admitting: Internal Medicine

## 2019-07-05 DIAGNOSIS — Z1231 Encounter for screening mammogram for malignant neoplasm of breast: Secondary | ICD-10-CM | POA: Diagnosis not present

## 2019-07-17 ENCOUNTER — Ambulatory Visit: Payer: PPO | Attending: Internal Medicine

## 2019-07-17 DIAGNOSIS — Z23 Encounter for immunization: Secondary | ICD-10-CM | POA: Insufficient documentation

## 2019-07-17 NOTE — Progress Notes (Signed)
   Covid-19 Vaccination Clinic  Name:  Brittany Alvarez    MRN: [id number] DOB: Dec 02, 1945  07/17/2019  Brittany Alvarez was observed post Covid-19 immunization for 15 minutes without incidence. She was provided with Vaccine Information Sheet and instruction to access the V-Safe system.   Brittany Alvarez was instructed to call 911 with any severe reactions post vaccine: Marland Kitchen Difficulty breathing  . Swelling of your face and throat  . A fast heartbeat  . A bad rash all over your body  . Dizziness and weakness    Immunizations Administered    Name Date Dose VIS Date Route   Pfizer COVID-19 Vaccine 07/17/2019 10:36 AM 0.3 mL 05/06/2019 Intramuscular   Manufacturer: [place]   Lot: [id number]   [place]: [id number]

## 2019-07-26 ENCOUNTER — Other Ambulatory Visit: Payer: Self-pay

## 2019-07-26 ENCOUNTER — Encounter: Payer: Self-pay | Admitting: Ophthalmology

## 2019-07-28 ENCOUNTER — Other Ambulatory Visit
Admission: RE | Admit: 2019-07-28 | Discharge: 2019-07-28 | Disposition: A | Discharge status: Home / Self Care | Payer: PPO | Source: Ambulatory Visit | Attending: Ophthalmology | Admitting: Ophthalmology

## 2019-07-28 ENCOUNTER — Other Ambulatory Visit: Payer: Self-pay

## 2019-07-28 DIAGNOSIS — Z20822 Contact with and (suspected) exposure to covid-19: Secondary | ICD-10-CM | POA: Insufficient documentation

## 2019-07-28 DIAGNOSIS — Z01812 Encounter for preprocedural laboratory examination: Secondary | ICD-10-CM | POA: Insufficient documentation

## 2019-07-28 LAB — SARS CORONAVIRUS 2 (TAT 6-24 HRS): SARS Coronavirus 2: NEGATIVE

## 2019-07-29 NOTE — Discharge Instructions (Signed)

## 2019-08-01 ENCOUNTER — Encounter: Payer: Self-pay | Admitting: Ophthalmology

## 2019-08-01 ENCOUNTER — Ambulatory Visit: Payer: PPO | Admitting: Anesthesiology

## 2019-08-01 ENCOUNTER — Ambulatory Visit
Admission: RE | Admit: 2019-08-01 | Discharge: 2019-08-01 | Disposition: A | Discharge status: Home / Self Care | Payer: PPO | Attending: Ophthalmology | Admitting: Ophthalmology

## 2019-08-01 ENCOUNTER — Encounter
Admission: RE | Disposition: A | Discharge status: Home / Self Care | Payer: Self-pay | Source: Home / Self Care | Attending: Ophthalmology

## 2019-08-01 ENCOUNTER — Other Ambulatory Visit: Payer: Self-pay

## 2019-08-01 DIAGNOSIS — I10 Essential (primary) hypertension: Secondary | ICD-10-CM | POA: Diagnosis not present

## 2019-08-01 DIAGNOSIS — K219 Gastro-esophageal reflux disease without esophagitis: Secondary | ICD-10-CM | POA: Insufficient documentation

## 2019-08-01 DIAGNOSIS — E559 Vitamin D deficiency, unspecified: Secondary | ICD-10-CM | POA: Insufficient documentation

## 2019-08-01 DIAGNOSIS — M1990 Unspecified osteoarthritis, unspecified site: Secondary | ICD-10-CM | POA: Insufficient documentation

## 2019-08-01 DIAGNOSIS — H25812 Combined forms of age-related cataract, left eye: Secondary | ICD-10-CM | POA: Diagnosis not present

## 2019-08-01 DIAGNOSIS — F419 Anxiety disorder, unspecified: Secondary | ICD-10-CM | POA: Insufficient documentation

## 2019-08-01 DIAGNOSIS — Z7982 Long term (current) use of aspirin: Secondary | ICD-10-CM | POA: Diagnosis not present

## 2019-08-01 DIAGNOSIS — H2512 Age-related nuclear cataract, left eye: Secondary | ICD-10-CM | POA: Diagnosis not present

## 2019-08-01 DIAGNOSIS — F411 Generalized anxiety disorder: Secondary | ICD-10-CM | POA: Insufficient documentation

## 2019-08-01 DIAGNOSIS — D649 Anemia, unspecified: Secondary | ICD-10-CM | POA: Diagnosis not present

## 2019-08-01 DIAGNOSIS — Z79899 Other long term (current) drug therapy: Secondary | ICD-10-CM | POA: Diagnosis not present

## 2019-08-01 DIAGNOSIS — M810 Age-related osteoporosis without current pathological fracture: Secondary | ICD-10-CM | POA: Insufficient documentation

## 2019-08-01 DIAGNOSIS — E785 Hyperlipidemia, unspecified: Secondary | ICD-10-CM | POA: Insufficient documentation

## 2019-08-01 DIAGNOSIS — Z981 Arthrodesis status: Secondary | ICD-10-CM | POA: Diagnosis not present

## 2019-08-01 HISTORY — PX: CATARACT EXTRACTION W/PHACO: SHX586

## 2019-08-01 SURGERY — PHACOEMULSIFICATION, CATARACT, WITH IOL INSERTION
Anesthesia: Monitor Anesthesia Care | Site: Eye | Laterality: Left

## 2019-08-01 MED ORDER — FENTANYL CITRATE (PF) 100 MCG/2ML IJ SOLN
INTRAMUSCULAR | Status: DC | PRN
  Administered 2019-08-01 (×2): 50 ug via INTRAVENOUS

## 2019-08-01 MED ORDER — MOXIFLOXACIN HCL 0.5 % OP SOLN
OPHTHALMIC | Status: DC | PRN
  Administered 2019-08-01: 0.2 mL via OPHTHALMIC

## 2019-08-01 MED ORDER — MIDAZOLAM HCL 2 MG/2ML IJ SOLN
INTRAMUSCULAR | Status: DC | PRN
  Administered 2019-08-01 (×2): 1 mg via INTRAVENOUS

## 2019-08-01 MED ORDER — TETRACAINE HCL 0.5 % OP SOLN
1.0000 [drp] | OPHTHALMIC | Status: DC | PRN
  Administered 2019-08-01 (×3): 1 [drp] via OPHTHALMIC

## 2019-08-01 MED ORDER — ACETAMINOPHEN 325 MG PO TABS: 325.0000 mg | ORAL_TABLET | ORAL | Status: DC | PRN

## 2019-08-01 MED ORDER — EPINEPHRINE PF 1 MG/ML IJ SOLN
INTRAOCULAR | Status: DC | PRN
  Administered 2019-08-01: 97 mL via OPHTHALMIC

## 2019-08-01 MED ORDER — LIDOCAINE HCL (PF) 2 % IJ SOLN
INTRAOCULAR | Status: DC | PRN
  Administered 2019-08-01: 1 mL via INTRAOCULAR

## 2019-08-01 MED ORDER — SODIUM HYALURONATE 23 MG/ML IO SOLN
INTRAOCULAR | Status: DC | PRN
  Administered 2019-08-01: 0.6 mL via INTRAOCULAR

## 2019-08-01 MED ORDER — SODIUM HYALURONATE 10 MG/ML IO SOLN
INTRAOCULAR | Status: DC | PRN
  Administered 2019-08-01: 0.55 mL via INTRAOCULAR

## 2019-08-01 MED ORDER — ARMC OPHTHALMIC DILATING DROPS
1.0000 "application " | OPHTHALMIC | Status: DC | PRN
  Administered 2019-08-01 (×3): 1 via OPHTHALMIC

## 2019-08-01 MED ORDER — ACETAMINOPHEN 160 MG/5ML PO SOLN: 325.0000 mg | ORAL | Status: DC | PRN

## 2019-08-01 SURGICAL SUPPLY — 18 items
CANNULA ANT/CHMB 27G (MISCELLANEOUS) ×2 IMPLANT
CANNULA ANT/CHMB 27GA (MISCELLANEOUS) ×6 IMPLANT
DISSECTOR HYDRO NUCLEUS 50X22 (MISCELLANEOUS) ×3 IMPLANT
GLOVE BIOGEL PI IND STRL 8 (GLOVE) IMPLANT
GLOVE BIOGEL PI INDICATOR 8 (GLOVE) ×4
GLOVE PI ULTRA LF STRL 7.5 (GLOVE) IMPLANT
GLOVE PI ULTRA NON LATEX 7.5 (GLOVE) ×2
GOWN STRL REUS W/ TWL LRG LVL3 (GOWN DISPOSABLE) ×2 IMPLANT
GOWN STRL REUS W/TWL LRG LVL3 (GOWN DISPOSABLE) ×6
LENS IOL TECNIS ITEC 24.5 (Intraocular Lens) ×2 IMPLANT
MARKER SKIN DUAL TIP RULER LAB (MISCELLANEOUS) ×3 IMPLANT
PACK DR. KING ARMS (PACKS) ×3 IMPLANT
PACK EYE AFTER SURG (MISCELLANEOUS) ×3 IMPLANT
PACK OPTHALMIC (MISCELLANEOUS) ×3 IMPLANT
SYR 3ML LL SCALE MARK (SYRINGE) ×3 IMPLANT
SYR TB 1ML LUER SLIP (SYRINGE) ×3 IMPLANT
WATER STERILE IRR 250ML POUR (IV SOLUTION) ×3 IMPLANT
WIPE NON LINTING 3.25X3.25 (MISCELLANEOUS) ×3 IMPLANT

## 2019-08-01 NOTE — Op Note (Signed)
OPERATIVE NOTE  KIARALIZ TRATHEN [id number] 08/01/2019   PREOPERATIVE DIAGNOSIS:  Nuclear sclerotic cataract left eye.  H25.12   POSTOPERATIVE DIAGNOSIS:    Nuclear sclerotic cataract left eye.     PROCEDURE:  Phacoemusification with posterior chamber intraocular lens placement of the left eye   LENS:   Implant Name Type Inv. Item Serial No. Manufacturer Lot No. LRB No. Used Action  LENS IOL DIOP 24.5 - [id number] Intraocular Lens LENS IOL DIOP 24.5 [id number]  Left 1 Implanted      Procedure(s) with comments: CATARACT EXTRACTION PHACO AND INTRAOCULAR LENS PLACEMENT (IOC) LEFT (Left) - CDE 16.0 U/S 1:29.4  PCB00 +24.5   ULTRASOUND TIME: 1 minutes 29 seconds.  CDE 16.0   SURGEON:  Benay Pillow, MD, MPH   ANESTHESIA:  Topical with tetracaine drops augmented with 1% preservative-free intracameral lidocaine.  ESTIMATED BLOOD LOSS: <1 mL   COMPLICATIONS:  None.   DESCRIPTION OF PROCEDURE:  The patient was identified in the holding room and transported to the operating room and placed in the supine position under the operating microscope.  The left eye was identified as the operative eye and it was prepped and draped in the usual sterile ophthalmic fashion.   A 1.0 millimeter clear-corneal paracentesis was made at the 5:00 position. 0.5 ml of preservative-free 1% lidocaine with epinephrine was injected into the anterior chamber.  The anterior chamber was filled with Healon 5 viscoelastic.  A 2.4 millimeter keratome was used to make a near-clear corneal incision at the 2:00 position.  A curvilinear capsulorrhexis was made with a cystotome and capsulorrhexis forceps.  Balanced salt solution was used to hydrodissect and hydrodelineate the nucleus.   Phacoemulsification was then used in stop and chop fashion to remove the lens nucleus and epinucleus.  The remaining cortex was then removed using the irrigation and aspiration handpiece. Healon was then placed into the capsular bag to  distend it for lens placement.  A lens was then injected into the capsular bag.  The remaining viscoelastic was aspirated.   Wounds were hydrated with balanced salt solution.  The anterior chamber was inflated to a physiologic pressure with balanced salt solution.  Intracameral vigamox 0.1 mL undiltued was injected into the eye and a drop placed onto the ocular surface.  No wound leaks were noted.  The patient was taken to the recovery room in stable condition without complications of anesthesia or surgery  Benay Pillow 08/01/2019, 10:58 AM

## 2019-08-01 NOTE — Anesthesia Postprocedure Evaluation (Signed)
Anesthesia Post Note  Patient: Brittany Alvarez  Procedure(s) Performed: CATARACT EXTRACTION PHACO AND INTRAOCULAR LENS PLACEMENT (IOC) LEFT (Left Eye)     Patient location during evaluation: PACU Anesthesia Type: MAC Level of consciousness: awake and alert Pain management: pain level controlled Vital Signs Assessment: post-procedure vital signs reviewed and stable Respiratory status: spontaneous breathing, nonlabored ventilation, respiratory function stable and patient connected to nasal cannula oxygen Cardiovascular status: stable and blood pressure returned to baseline Postop Assessment: no apparent nausea or vomiting Anesthetic complications: no    Wanda Plump Kwamaine Cuppett

## 2019-08-01 NOTE — Transfer of Care (Signed)
Immediate Anesthesia Transfer of Care Note  Patient: Brittany Alvarez  Procedure(s) Performed: CATARACT EXTRACTION PHACO AND INTRAOCULAR LENS PLACEMENT (IOC) LEFT (Left Eye)  Patient Location: PACU  Anesthesia Type: MAC  Level of Consciousness: awake, alert  and patient cooperative  Airway and Oxygen Therapy: Patient Spontanous Breathing and Patient connected to supplemental oxygen  Post-op Assessment: Post-op Vital signs reviewed, Patient's Cardiovascular Status Stable, Respiratory Function Stable, Patent Airway and No signs of Nausea or vomiting  Post-op Vital Signs: Reviewed and stable  Complications: No apparent anesthesia complications

## 2019-08-01 NOTE — H&P (Signed)

## 2019-08-01 NOTE — Anesthesia Preprocedure Evaluation (Signed)
Anesthesia Evaluation  Patient identified by MRN, date of birth, ID band  Airway Mallampati: II  TM Distance: >3 FB     Dental   Pulmonary neg pulmonary ROS,    Pulmonary exam normal        Cardiovascular hypertension, Normal cardiovascular exam     Neuro/Psych PSYCHIATRIC DISORDERS Anxiety    GI/Hepatic GERD  ,  Endo/Other    Renal/GU      Musculoskeletal  (+) Arthritis ,   Abdominal   Peds  Hematology  (+) anemia ,   Anesthesia Other Findings   Reproductive/Obstetrics                             Anesthesia Physical Anesthesia Plan  ASA: II  Anesthesia Plan: MAC   Post-op Pain Management:    Induction: Intravenous  PONV Risk Score and Plan:   Airway Management Planned: Natural Airway and Nasal Cannula  Additional Equipment:   Intra-op Plan:   Post-operative Plan:   Informed Consent: I have reviewed the patients History and Physical, chart, labs and discussed the procedure including the risks, benefits and alternatives for the proposed anesthesia with the patient or authorized representative who has indicated his/her understanding and acceptance.       Plan Discussed with: CRNA, Anesthesiologist and Surgeon  Anesthesia Plan Comments:         Anesthesia Quick Evaluation

## 2019-08-01 NOTE — Anesthesia Procedure Notes (Signed)
Procedure Name: MAC Performed by: Ander Wamser M, CRNA Pre-anesthesia Checklist: Timeout performed, Patient being monitored, Suction available, Emergency Drugs available and Patient identified Patient Re-evaluated:Patient Re-evaluated prior to induction Oxygen Delivery Method: Nasal cannula       

## 2019-08-01 NOTE — Anesthesia Procedure Notes (Signed)
Procedure Name: MAC Performed by: Izetta Dakin, CRNA Pre-anesthesia Checklist: Patient identified, Emergency Drugs available, Suction available and Patient being monitored Patient Re-evaluated:Patient Re-evaluated prior to induction Oxygen Delivery Method: Nasal cannula

## 2019-08-02 ENCOUNTER — Encounter: Payer: Self-pay | Admitting: *Deleted

## 2019-08-10 ENCOUNTER — Ambulatory Visit: Payer: PPO | Attending: Internal Medicine

## 2019-08-10 DIAGNOSIS — K219 Gastro-esophageal reflux disease without esophagitis: Secondary | ICD-10-CM | POA: Diagnosis not present

## 2019-08-10 DIAGNOSIS — F419 Anxiety disorder, unspecified: Secondary | ICD-10-CM | POA: Diagnosis not present

## 2019-08-10 DIAGNOSIS — I10 Essential (primary) hypertension: Secondary | ICD-10-CM | POA: Diagnosis not present

## 2019-08-10 DIAGNOSIS — Z23 Encounter for immunization: Secondary | ICD-10-CM

## 2019-08-10 DIAGNOSIS — Z0001 Encounter for general adult medical examination with abnormal findings: Secondary | ICD-10-CM | POA: Diagnosis not present

## 2019-08-10 DIAGNOSIS — Z0000 Encounter for general adult medical examination without abnormal findings: Secondary | ICD-10-CM | POA: Diagnosis not present

## 2019-08-10 NOTE — Progress Notes (Signed)
   Covid-19 Vaccination Clinic  Name:  Brittany Alvarez    MRN: [id number] DOB: 1946-04-23  08/10/2019  [name] was observed post Covid-19 immunization for 15 minutes without incident. She was provided with Vaccine Information Sheet and instruction to access the V-Safe system.   [name] was instructed to call 911 with any severe reactions post vaccine: Marland Kitchen Difficulty breathing  . Swelling of face and throat  . A fast heartbeat  . A bad rash all over body  . Dizziness and weakness   Immunizations Administered    Name Date Dose VIS Date Route   Pfizer COVID-19 Vaccine 08/10/2019  1:10 PM 0.3 mL 05/06/2019 Intramuscular   Manufacturer: [place]   Lot: [id number]   [place]: [id number]

## 2019-08-11 ENCOUNTER — Other Ambulatory Visit: Payer: Self-pay

## 2019-08-11 ENCOUNTER — Encounter: Payer: Self-pay | Admitting: Ophthalmology

## 2019-08-12 DIAGNOSIS — H2511 Age-related nuclear cataract, right eye: Secondary | ICD-10-CM | POA: Diagnosis not present

## 2019-08-17 NOTE — Anesthesia Preprocedure Evaluation (Addendum)
Anesthesia Evaluation  Patient identified by MRN, date of birth, ID band Patient awake    Reviewed: Allergy & Precautions, NPO status , Patient's Chart, lab work & pertinent test results  History of Anesthesia Complications Negative for: history of anesthetic complications  Airway Mallampati: II  TM Distance: >3 FB Neck ROM: Full    Dental   Pulmonary neg pulmonary ROS,    breath sounds clear to auscultation       Cardiovascular hypertension, (-) angina(-) DOE  Rhythm:Regular Rate:Normal   HLD   Neuro/Psych PSYCHIATRIC DISORDERS Anxiety    GI/Hepatic GERD  Controlled,  Endo/Other    Renal/GU      Musculoskeletal  (+) Arthritis ,   Abdominal   Peds  Hematology  (+) anemia ,   Anesthesia Other Findings   Reproductive/Obstetrics                            Anesthesia Physical  Anesthesia Plan  ASA: II  Anesthesia Plan: MAC   Post-op Pain Management:    Induction: Intravenous  PONV Risk Score and Plan: 2 and TIVA, Midazolam and Treatment may vary due to age or medical condition  Airway Management Planned: Natural Airway and Nasal Cannula  Additional Equipment:   Intra-op Plan:   Post-operative Plan:   Informed Consent: I have reviewed the patients History and Physical, chart, labs and discussed the procedure including the risks, benefits and alternatives for the proposed anesthesia with the patient or authorized representative who has indicated his/her understanding and acceptance.       Plan Discussed with: CRNA, Anesthesiologist and Surgeon  Anesthesia Plan Comments:         Anesthesia Quick Evaluation

## 2019-08-18 ENCOUNTER — Other Ambulatory Visit: Payer: Self-pay

## 2019-08-18 ENCOUNTER — Other Ambulatory Visit
Admission: RE | Admit: 2019-08-18 | Discharge: 2019-08-18 | Disposition: A | Discharge status: Home / Self Care | Payer: PPO | Source: Ambulatory Visit | Attending: Ophthalmology | Admitting: Ophthalmology

## 2019-08-18 DIAGNOSIS — Z01812 Encounter for preprocedural laboratory examination: Secondary | ICD-10-CM | POA: Insufficient documentation

## 2019-08-18 DIAGNOSIS — Z20822 Contact with and (suspected) exposure to covid-19: Secondary | ICD-10-CM | POA: Diagnosis not present

## 2019-08-18 LAB — SARS CORONAVIRUS 2 (TAT 6-24 HRS): SARS Coronavirus 2: NEGATIVE

## 2019-08-18 NOTE — Discharge Instructions (Signed)

## 2019-08-22 ENCOUNTER — Ambulatory Visit: Payer: PPO | Admitting: Anesthesiology

## 2019-08-22 ENCOUNTER — Encounter: Payer: Self-pay | Admitting: Ophthalmology

## 2019-08-22 ENCOUNTER — Ambulatory Visit
Admission: RE | Admit: 2019-08-22 | Discharge: 2019-08-22 | Disposition: A | Discharge status: Home / Self Care | Payer: PPO | Attending: Ophthalmology | Admitting: Ophthalmology

## 2019-08-22 ENCOUNTER — Other Ambulatory Visit: Payer: Self-pay

## 2019-08-22 ENCOUNTER — Encounter
Admission: RE | Disposition: A | Discharge status: Home / Self Care | Payer: Self-pay | Source: Home / Self Care | Attending: Ophthalmology

## 2019-08-22 DIAGNOSIS — Z79899 Other long term (current) drug therapy: Secondary | ICD-10-CM | POA: Insufficient documentation

## 2019-08-22 DIAGNOSIS — H9190 Unspecified hearing loss, unspecified ear: Secondary | ICD-10-CM | POA: Insufficient documentation

## 2019-08-22 DIAGNOSIS — E785 Hyperlipidemia, unspecified: Secondary | ICD-10-CM | POA: Insufficient documentation

## 2019-08-22 DIAGNOSIS — M810 Age-related osteoporosis without current pathological fracture: Secondary | ICD-10-CM | POA: Diagnosis not present

## 2019-08-22 DIAGNOSIS — I10 Essential (primary) hypertension: Secondary | ICD-10-CM | POA: Diagnosis not present

## 2019-08-22 DIAGNOSIS — Z9842 Cataract extraction status, left eye: Secondary | ICD-10-CM | POA: Diagnosis not present

## 2019-08-22 DIAGNOSIS — Z7983 Long term (current) use of bisphosphonates: Secondary | ICD-10-CM | POA: Diagnosis not present

## 2019-08-22 DIAGNOSIS — H25811 Combined forms of age-related cataract, right eye: Secondary | ICD-10-CM | POA: Diagnosis not present

## 2019-08-22 DIAGNOSIS — Z7982 Long term (current) use of aspirin: Secondary | ICD-10-CM | POA: Diagnosis not present

## 2019-08-22 DIAGNOSIS — H2511 Age-related nuclear cataract, right eye: Secondary | ICD-10-CM | POA: Diagnosis not present

## 2019-08-22 DIAGNOSIS — E7800 Pure hypercholesterolemia, unspecified: Secondary | ICD-10-CM | POA: Diagnosis not present

## 2019-08-22 HISTORY — PX: CATARACT EXTRACTION W/PHACO: SHX586

## 2019-08-22 SURGERY — PHACOEMULSIFICATION, CATARACT, WITH IOL INSERTION
Anesthesia: Monitor Anesthesia Care | Site: Eye | Laterality: Right

## 2019-08-22 MED ORDER — TETRACAINE HCL 0.5 % OP SOLN
1.0000 [drp] | OPHTHALMIC | Status: DC | PRN
  Administered 2019-08-22 (×3): 1 [drp] via OPHTHALMIC

## 2019-08-22 MED ORDER — SODIUM HYALURONATE 23 MG/ML IO SOLN
INTRAOCULAR | Status: DC | PRN
  Administered 2019-08-22: 0.6 mL via INTRAOCULAR

## 2019-08-22 MED ORDER — FENTANYL CITRATE (PF) 100 MCG/2ML IJ SOLN
INTRAMUSCULAR | Status: DC | PRN
  Administered 2019-08-22: 100 ug via INTRAVENOUS

## 2019-08-22 MED ORDER — ACETAMINOPHEN 10 MG/ML IV SOLN: 1000.0000 mg | Freq: Once | INTRAVENOUS | Status: DC | PRN

## 2019-08-22 MED ORDER — SODIUM HYALURONATE 10 MG/ML IO SOLN
INTRAOCULAR | Status: DC | PRN
  Administered 2019-08-22: 0.55 mL via INTRAOCULAR

## 2019-08-22 MED ORDER — LIDOCAINE HCL (PF) 2 % IJ SOLN
INTRAOCULAR | Status: DC | PRN
  Administered 2019-08-22: 1 mL via INTRAOCULAR

## 2019-08-22 MED ORDER — ONDANSETRON HCL 4 MG/2ML IJ SOLN: 4.0000 mg | Freq: Once | INTRAMUSCULAR | Status: DC | PRN

## 2019-08-22 MED ORDER — ARMC OPHTHALMIC DILATING DROPS
1.0000 "application " | OPHTHALMIC | Status: DC | PRN
  Administered 2019-08-22 (×3): 1 via OPHTHALMIC

## 2019-08-22 MED ORDER — MOXIFLOXACIN HCL 0.5 % OP SOLN
OPHTHALMIC | Status: DC | PRN
  Administered 2019-08-22: 0.2 mL via OPHTHALMIC

## 2019-08-22 MED ORDER — LACTATED RINGERS IV SOLN: 100.0000 mL/h | INTRAVENOUS | Status: DC

## 2019-08-22 MED ORDER — MIDAZOLAM HCL 2 MG/2ML IJ SOLN
INTRAMUSCULAR | Status: DC | PRN
  Administered 2019-08-22: 2 mg via INTRAVENOUS

## 2019-08-22 MED ORDER — EPINEPHRINE PF 1 MG/ML IJ SOLN
INTRAOCULAR | Status: DC | PRN
  Administered 2019-08-22: 63 mL via OPHTHALMIC

## 2019-08-22 SURGICAL SUPPLY — 18 items
CANNULA ANT/CHMB 27G (MISCELLANEOUS) ×2 IMPLANT
CANNULA ANT/CHMB 27GA (MISCELLANEOUS) ×6 IMPLANT
DISSECTOR HYDRO NUCLEUS 50X22 (MISCELLANEOUS) ×3 IMPLANT
GLOVE BIOGEL PI IND STRL 7.0 (GLOVE) IMPLANT
GLOVE BIOGEL PI IND STRL 7.5 (GLOVE) IMPLANT
GLOVE BIOGEL PI INDICATOR 7.0 (GLOVE) ×2
GLOVE BIOGEL PI INDICATOR 7.5 (GLOVE) ×4
GOWN STRL REUS W/ TWL LRG LVL3 (GOWN DISPOSABLE) ×2 IMPLANT
GOWN STRL REUS W/TWL LRG LVL3 (GOWN DISPOSABLE) ×6
LENS IOL TECNIS ITEC 24.5 (Intraocular Lens) ×2 IMPLANT
MARKER SKIN DUAL TIP RULER LAB (MISCELLANEOUS) ×3 IMPLANT
PACK DR. KING ARMS (PACKS) ×3 IMPLANT
PACK EYE AFTER SURG (MISCELLANEOUS) ×3 IMPLANT
PACK OPTHALMIC (MISCELLANEOUS) ×3 IMPLANT
SYR 3ML LL SCALE MARK (SYRINGE) ×3 IMPLANT
SYR TB 1ML LUER SLIP (SYRINGE) ×3 IMPLANT
WATER STERILE IRR 250ML POUR (IV SOLUTION) ×3 IMPLANT
WIPE NON LINTING 3.25X3.25 (MISCELLANEOUS) ×3 IMPLANT

## 2019-08-22 NOTE — Op Note (Signed)
OPERATIVE NOTE  Brittany Alvarez [id number] 08/22/2019   PREOPERATIVE DIAGNOSIS:  Nuclear sclerotic cataract right eye.  H25.11   POSTOPERATIVE DIAGNOSIS:    Nuclear sclerotic cataract right eye.     PROCEDURE:  Phacoemusification with posterior chamber intraocular lens placement of the right eye   LENS:   Implant Name Type Inv. Item Serial No. Manufacturer Lot No. LRB No. Used Action  LENS IOL DIOP 24.5 - [id number] Intraocular Lens LENS IOL DIOP 24.5 [id number]  Right 1 Implanted       Procedure(s) with comments: CATARACT EXTRACTION PHACO AND INTRAOCULAR LENS PLACEMENT (IOC) RIGHT (Right) - 1.98 0:25.2  PCB00 +24.5   ULTRASOUND TIME: 0 minutes 25 seconds.  CDE 1.98   SURGEON:  Benay Pillow, MD, MPH  ANESTHESIOLOGIST: Anesthesiologist: Heniser, Fredric Dine, MD CRNA: Silvana Newness, CRNA   ANESTHESIA:  Topical with tetracaine drops augmented with 1% preservative-free intracameral lidocaine.  ESTIMATED BLOOD LOSS: less than 1 mL.   COMPLICATIONS:  None.   DESCRIPTION OF PROCEDURE:  The patient was identified in the holding room and transported to the operating room and placed in the supine position under the operating microscope.  The right eye was identified as the operative eye and it was prepped and draped in the usual sterile ophthalmic fashion.   A 1.0 millimeter clear-corneal paracentesis was made at the 10:30 position. 0.5 ml of preservative-free 1% lidocaine with epinephrine was injected into the anterior chamber.  The anterior chamber was filled with Healon 5 viscoelastic.  A 2.4 millimeter keratome was used to make a near-clear corneal incision at the 8:00 position.  A curvilinear capsulorrhexis was made with a cystotome and capsulorrhexis forceps.  Balanced salt solution was used to hydrodissect and hydrodelineate the nucleus.   Phacoemulsification was then used in stop and chop fashion to remove the lens nucleus and epinucleus.  The remaining cortex was then removed  using the irrigation and aspiration handpiece. Healon was then placed into the capsular bag to distend it for lens placement.  A lens was then injected into the capsular bag.  The remaining viscoelastic was aspirated.   Wounds were hydrated with balanced salt solution.  The anterior chamber was inflated to a physiologic pressure with balanced salt solution.   Intracameral vigamox 0.1 mL undiluted was injected into the eye and a drop placed onto the ocular surface.  No wound leaks were noted.  The patient was taken to the recovery room in stable condition without complications of anesthesia or surgery  Benay Pillow 08/22/2019, 8:58 AM

## 2019-08-22 NOTE — H&P (Signed)

## 2019-08-22 NOTE — Anesthesia Postprocedure Evaluation (Signed)
Anesthesia Post Note  Patient: Brittany Alvarez  Procedure(s) Performed: CATARACT EXTRACTION PHACO AND INTRAOCULAR LENS PLACEMENT (IOC) RIGHT (Right Eye)     Patient location during evaluation: PACU Anesthesia Type: MAC Level of consciousness: awake and alert Pain management: pain level controlled Vital Signs Assessment: post-procedure vital signs reviewed and stable Respiratory status: spontaneous breathing, nonlabored ventilation, respiratory function stable and patient connected to nasal cannula oxygen Cardiovascular status: stable and blood pressure returned to baseline Postop Assessment: no apparent nausea or vomiting Anesthetic complications: no    Karina Lenderman A  Briane Birden

## 2019-08-22 NOTE — Transfer of Care (Signed)
Immediate Anesthesia Transfer of Care Note  Patient: Brittany Alvarez  Procedure(s) Performed: CATARACT EXTRACTION PHACO AND INTRAOCULAR LENS PLACEMENT (IOC) RIGHT (Right Eye)  Patient Location: PACU  Anesthesia Type: MAC  Level of Consciousness: awake, alert  and patient cooperative  Airway and Oxygen Therapy: Patient Spontanous Breathing and Patient connected to supplemental oxygen  Post-op Assessment: Post-op Vital signs reviewed, Patient's Cardiovascular Status Stable, Respiratory Function Stable, Patent Airway and No signs of Nausea or vomiting  Post-op Vital Signs: Reviewed and stable  Complications: No apparent anesthesia complications

## 2019-08-23 ENCOUNTER — Encounter: Payer: Self-pay | Admitting: *Deleted

## 2019-10-18 ENCOUNTER — Ambulatory Visit: Payer: PPO | Admitting: Internal Medicine

## 2019-10-19 DIAGNOSIS — S20469A Insect bite (nonvenomous) of unspecified back wall of thorax, initial encounter: Secondary | ICD-10-CM | POA: Diagnosis not present

## 2019-11-14 DIAGNOSIS — H02055 Trichiasis without entropian left lower eyelid: Secondary | ICD-10-CM | POA: Diagnosis not present

## 2020-01-29 ENCOUNTER — Other Ambulatory Visit: Payer: Self-pay | Admitting: Internal Medicine

## 2020-02-03 DIAGNOSIS — I10 Essential (primary) hypertension: Secondary | ICD-10-CM | POA: Diagnosis not present

## 2020-02-10 DIAGNOSIS — M810 Age-related osteoporosis without current pathological fracture: Secondary | ICD-10-CM | POA: Diagnosis not present

## 2020-02-10 DIAGNOSIS — Z23 Encounter for immunization: Secondary | ICD-10-CM | POA: Diagnosis not present

## 2020-02-10 DIAGNOSIS — I10 Essential (primary) hypertension: Secondary | ICD-10-CM | POA: Diagnosis not present

## 2020-02-10 DIAGNOSIS — F419 Anxiety disorder, unspecified: Secondary | ICD-10-CM | POA: Diagnosis not present

## 2020-02-10 DIAGNOSIS — K219 Gastro-esophageal reflux disease without esophagitis: Secondary | ICD-10-CM | POA: Diagnosis not present

## 2020-02-27 ENCOUNTER — Other Ambulatory Visit: Payer: Self-pay | Admitting: Internal Medicine

## 2020-02-27 DIAGNOSIS — I10 Essential (primary) hypertension: Secondary | ICD-10-CM

## 2020-04-23 ENCOUNTER — Other Ambulatory Visit: Payer: Self-pay | Admitting: Internal Medicine

## 2020-04-24 ENCOUNTER — Encounter: Payer: PPO | Admitting: Internal Medicine

## 2020-04-26 ENCOUNTER — Other Ambulatory Visit: Payer: Self-pay | Admitting: Internal Medicine

## 2020-05-21 ENCOUNTER — Other Ambulatory Visit: Payer: Self-pay | Admitting: Internal Medicine

## 2020-05-21 DIAGNOSIS — I10 Essential (primary) hypertension: Secondary | ICD-10-CM

## 2020-06-01 DIAGNOSIS — J329 Chronic sinusitis, unspecified: Secondary | ICD-10-CM | POA: Diagnosis not present

## 2020-06-06 ENCOUNTER — Other Ambulatory Visit: Payer: Self-pay | Admitting: Internal Medicine

## 2020-06-06 DIAGNOSIS — Z1231 Encounter for screening mammogram for malignant neoplasm of breast: Secondary | ICD-10-CM

## 2020-07-10 ENCOUNTER — Other Ambulatory Visit: Payer: Self-pay

## 2020-07-10 ENCOUNTER — Ambulatory Visit
Admission: RE | Admit: 2020-07-10 | Discharge: 2020-07-10 | Disposition: A | Discharge status: Home / Self Care | Payer: PPO | Source: Ambulatory Visit | Attending: Internal Medicine | Admitting: Internal Medicine

## 2020-07-10 DIAGNOSIS — Z1231 Encounter for screening mammogram for malignant neoplasm of breast: Secondary | ICD-10-CM | POA: Diagnosis not present

## 2020-08-07 DIAGNOSIS — I10 Essential (primary) hypertension: Secondary | ICD-10-CM | POA: Diagnosis not present

## 2020-08-14 DIAGNOSIS — Z0000 Encounter for general adult medical examination without abnormal findings: Secondary | ICD-10-CM | POA: Diagnosis not present

## 2020-08-14 DIAGNOSIS — I10 Essential (primary) hypertension: Secondary | ICD-10-CM | POA: Diagnosis not present

## 2020-08-14 DIAGNOSIS — M810 Age-related osteoporosis without current pathological fracture: Secondary | ICD-10-CM | POA: Diagnosis not present

## 2020-08-14 DIAGNOSIS — K219 Gastro-esophageal reflux disease without esophagitis: Secondary | ICD-10-CM | POA: Diagnosis not present

## 2020-08-14 DIAGNOSIS — F419 Anxiety disorder, unspecified: Secondary | ICD-10-CM | POA: Diagnosis not present

## 2020-08-14 DIAGNOSIS — Z0001 Encounter for general adult medical examination with abnormal findings: Secondary | ICD-10-CM | POA: Diagnosis not present

## 2020-08-14 DIAGNOSIS — D333 Benign neoplasm of cranial nerves: Secondary | ICD-10-CM | POA: Diagnosis not present

## 2020-08-21 ENCOUNTER — Emergency Department: Payer: PPO

## 2020-08-21 ENCOUNTER — Other Ambulatory Visit: Payer: Self-pay

## 2020-08-21 ENCOUNTER — Emergency Department
Admission: EM | Admit: 2020-08-21 | Discharge: 2020-08-21 | Disposition: A | Discharge status: Home / Self Care | Payer: PPO | Attending: Emergency Medicine | Admitting: Emergency Medicine

## 2020-08-21 DIAGNOSIS — Z91040 Latex allergy status: Secondary | ICD-10-CM | POA: Insufficient documentation

## 2020-08-21 DIAGNOSIS — Z79899 Other long term (current) drug therapy: Secondary | ICD-10-CM | POA: Diagnosis not present

## 2020-08-21 DIAGNOSIS — K529 Noninfective gastroenteritis and colitis, unspecified: Secondary | ICD-10-CM

## 2020-08-21 DIAGNOSIS — I10 Essential (primary) hypertension: Secondary | ICD-10-CM | POA: Diagnosis not present

## 2020-08-21 DIAGNOSIS — Z7982 Long term (current) use of aspirin: Secondary | ICD-10-CM | POA: Insufficient documentation

## 2020-08-21 DIAGNOSIS — R1032 Left lower quadrant pain: Secondary | ICD-10-CM | POA: Diagnosis not present

## 2020-08-21 DIAGNOSIS — R109 Unspecified abdominal pain: Secondary | ICD-10-CM | POA: Diagnosis not present

## 2020-08-21 DIAGNOSIS — K625 Hemorrhage of anus and rectum: Secondary | ICD-10-CM | POA: Diagnosis not present

## 2020-08-21 DIAGNOSIS — K219 Gastro-esophageal reflux disease without esophagitis: Secondary | ICD-10-CM | POA: Diagnosis not present

## 2020-08-21 LAB — COMPREHENSIVE METABOLIC PANEL
ALT: 13 U/L (ref 0–44)
AST: 23 U/L (ref 15–41)
Albumin: 4.3 g/dL (ref 3.5–5.0)
Alkaline Phosphatase: 57 U/L (ref 38–126)
Anion gap: 9 (ref 5–15)
BUN: 20 mg/dL (ref 8–23)
CO2: 25 mmol/L (ref 22–32)
Calcium: 9.5 mg/dL (ref 8.9–10.3)
Chloride: 103 mmol/L (ref 98–111)
Creatinine, Ser: 0.87 mg/dL (ref 0.44–1.00)
GFR, Estimated: >60 mL/min (ref >60)
Glucose, Bld: 104 mg/dL — ABNORMAL HIGH (ref 70–99)
Potassium: 3.9 mmol/L (ref 3.5–5.1)
Sodium: 137 mmol/L (ref 135–145)
Total Bilirubin: 0.8 mg/dL (ref 0.3–1.2)
Total Protein: 7.3 g/dL (ref 6.5–8.1)

## 2020-08-21 LAB — CBC
HCT: 40.7 % (ref 36.0–46.0)
Hemoglobin: 13.3 g/dL (ref 12.0–15.0)
MCH: 27.7 pg (ref 26.0–34.0)
MCHC: 32.7 g/dL (ref 30.0–36.0)
MCV: 84.6 fL (ref 80.0–100.0)
Platelets: 335 10*3/uL (ref 150–400)
RBC: 4.81 MIL/uL (ref 3.87–5.11)
RDW: 13.1 % (ref 11.5–15.5)
WBC: 9.7 10*3/uL (ref 4.0–10.5)
nRBC: 0 % (ref 0.0–0.2)

## 2020-08-21 LAB — TYPE AND SCREEN
ABO/RH(D): A POS
Antibody Screen: NEGATIVE

## 2020-08-21 MED ORDER — AMOXICILLIN-POT CLAVULANATE 875-125 MG PO TABS: 1.0000 | ORAL_TABLET | Freq: Two times a day (BID) | ORAL | 0 refills | Status: AC

## 2020-08-21 MED ORDER — IOHEXOL 300 MG/ML  SOLN
75.0000 mL | Freq: Once | INTRAMUSCULAR | Status: AC | PRN
  Administered 2020-08-21: 75 mL via INTRAVENOUS

## 2020-08-21 MED ORDER — AMOXICILLIN-POT CLAVULANATE 875-125 MG PO TABS
1.0000 | ORAL_TABLET | Freq: Once | ORAL | Status: AC
  Administered 2020-08-21: 1 via ORAL
  Filled 2020-08-21: qty 1

## 2020-08-21 MED ORDER — IOHEXOL 9 MG/ML PO SOLN
500.0000 mL | ORAL | Status: AC
  Administered 2020-08-21 (×2): 500 mL via ORAL

## 2020-08-21 NOTE — ED Triage Notes (Signed)
Pt c/o lower abd pain with bloody stools since Saturday, denies a hx of same, states she had a coloscopy that showed diverticulum

## 2020-08-21 NOTE — Discharge Instructions (Signed)
As we discussed please take antibiotics for the entire course.  Please follow-up with your doctor next 2 to 3 days for recheck/reevaluation.  Return immediately to the emergency department for any return of/worsening abdominal pain, return of or increased rectal bleeding, development of fever or vomiting.

## 2020-08-21 NOTE — ED Provider Notes (Signed)
[facility] Emergency Department Provider Note  Time seen: 10:01 AM  I have reviewed the triage vital signs and the nursing notes.   HISTORY  Chief Complaint GI Bleeding   HPI Brittany Alvarez is a 75 y.o. female with a past medical history of anemia, anxiety, arthritis, gastric reflux, hypertension, hyperlipidemia, presents emergency department for rectal bleeding left lower quadrant abdominal pain.  According to the patient  for the past 3 days now she has been experiencing mild to moderate dull pain in the left lower quadrant and has noticed in her stool.  Patient states regular colonoscopies with a history of diverticulosis but has never had diverticulitis.  States nausea on Saturday but denies any vomiting.  No nausea since.  No fever.  Past Medical History:  Diagnosis Date  . Anemia   . Anxiety   . Arthritis    right hand  . Blood transfusion    approx. 40 years ago  . Deaf, right   . Gallstones    hx of  . GERD (gastroesophageal reflux disease)   . Hyperlipidemia   . Hypertension    sees Dr. Army Melia in [place], [place]    Patient Active Problem List   Diagnosis Date Noted  . Vitamin D deficiency 04/17/2018  . Deaf 04/13/2015  . Generalized anxiety disorder 03/04/2015  . Benign colonic polyp 03/04/2015  . Degeneration of intervertebral disc of lumbar region 03/04/2015  . Dyslipidemia 03/04/2015  . Allergic state 03/04/2015  . Essential (primary) hypertension 03/04/2015  . Calcium blood increased 03/04/2015  . Arthritis of hand, degenerative 03/04/2015  . Osteoporosis without current pathological fracture 03/04/2015  . History of benign schwannoma 03/04/2015    Past Surgical History:  Procedure Laterality Date  . ANTERIOR LAT LUMBAR FUSION  06/24/2011   Procedure: ANTERIOR LATERAL LUMBAR FUSION 1 LEVEL;  Surgeon: Faythe Ghee, MD;  Location: [facility];  Service: Neurosurgery;  Laterality: Right;  Right Lumbar Three-Four  Anterior Lateral Lumbar Interbody Fusion with Right Lumbar Three-Four percutaneous pedicle screws (Lateral Postion for both procedures)  . APPENDECTOMY    . BACK SURGERY     x 2 (rods and screws in lumbar region)  . BREAST CYST ASPIRATION Left 1990   NEG  . BREAST EXCISIONAL BIOPSY Left age 89's   NEG  . CATARACT EXTRACTION W/PHACO Left 08/01/2019   Procedure: CATARACT EXTRACTION PHACO AND INTRAOCULAR LENS PLACEMENT (IOC) LEFT;  Surgeon: Eulogio Bear, MD;  Location: [place];  Service: Ophthalmology;  Laterality: Left;  CDE 16.0 U/S 1:29.4  . CATARACT EXTRACTION W/PHACO Right 08/22/2019   Procedure: CATARACT EXTRACTION PHACO AND INTRAOCULAR LENS PLACEMENT ([place]) RIGHT;  Surgeon: Eulogio Bear, MD;  Location: [place];  Service: Ophthalmology;  Laterality: Right;  1.98 0:25.2  . CHOLECYSTECTOMY    . COLONOSCOPY WITH PROPOFOL N/A 01/15/2016   Procedure: COLONOSCOPY WITH PROPOFOL;  Surgeon: Lollie Sails, MD;  Location: [facility] ENDOSCOPY;  Service: Endoscopy;  Laterality: N/A;  . COLONOSCOPY WITH PROPOFOL N/A 04/04/2019   Procedure: COLONOSCOPY WITH PROPOFOL;  Surgeon: Toledo, Benay Pike, MD;  Location: [facility] ENDOSCOPY;  Service: Gastroenterology;  Laterality: N/A;  . OVARY SURGERY     removal of left and left tube  . TONSILLECTOMY    . vestibular tumor resection Right 2016   schwannoma    Prior to Admission medications   Medication Sig Start Date End Date Taking? Authorizing Provider  alendronate (FOSAMAX) 70 MG tablet TAKE ONE TABLET BY MOUTH EACH WEEK,  ON AN EMPTY STOMACH BEFORE BREAKFAST WITH 8oz OF WATER AND REMAIN UPRIGHT FOR :30 03/28/19   Glean Hess, MD  aspirin 81 MG tablet Take 81 mg by mouth daily.    [provider]  busPIRone (BUSPAR) 10 MG tablet TAKE 1 TABLET BY MOUTH TWICE DAILY 06/13/19   Glean Hess, MD  Cholecalciferol (VITAMIN D3 SUPER STRENGTH) 50 MCG (2000 UT) CAPS Take 1 capsule by mouth daily.    [provider]  furosemide (LASIX) 20 MG tablet TAKE 1 TABLET BY MOUTH ONCE DAILY 04/25/19   Glean Hess, MD  lisinopril (ZESTRIL) 20 MG tablet TAKE 1 TABLET BY MOUTH ONCE DAILY 06/20/19   Glean Hess, MD  loratadine (CLARITIN) 10 MG tablet Take 10 mg by mouth daily. otc    [provider]  lovastatin (MEVACOR) 20 MG tablet TAKE 2 TABLETS BY MOUTH AT BEDTIME AS DIRECTED 04/11/19   Glean Hess, MD  Nutritional Supplements (NUTRITIONAL SUPPLEMENT PO) Take 1 capsule by mouth daily. Reports taking Cognium 100 mg once daily    [provider]  Probiotic Product (PROBIOTIC ADVANCED PO) Take by mouth.    [provider]  tiZANidine (ZANAFLEX) 4 MG tablet TAKE 1 TABLET BY MOUTH EVERY 8 HOURS AS NEEDED FOR MUSCLE SPASMS 05/27/18   Glean Hess, MD  vitamin B-12 (CYANOCOBALAMIN) 100 MCG tablet Take 100 mcg by mouth daily.    [provider]    Allergies  Allergen Reactions  . Povidone Iodine Rash  . Latex Hives    Patient states painful and rash, (only reaction was after back surgery when betadine was used. Unsure if Latex or betadine issue)  . Metolazone Other (See Comments)    Hypercalcemia  . Thiazide-Type Diuretics     Hypercalcemia  . Diflucan [Fluconazole] Rash    Possible rash that occurred one week after completing one week of diflucan    Family History  Problem Relation Age of Onset  . COPD Father   . Breast cancer Mother 30  . Heart failure Mother     Social History Social History   Tobacco Use  . Smoking status: Never Smoker  . Smokeless tobacco: Never Used  . Tobacco comment: smoking cessation materials not required  Vaping Use  . Vaping Use: Never used  Substance Use Topics  . Alcohol use: Yes    Alcohol/week: 0.0 standard drinks    Comment: occ - 1x/month  . Drug use: No    Review of Systems Constitutional: Negative for fever. Cardiovascular: Negative for chest pain. Respiratory: Negative for shortness of  breath. Gastrointestinal: Left lower quadrant abdominal pain.  Positive for rectal bleeding. Genitourinary: Negative for urinary compaints Musculoskeletal: Negative for musculoskeletal complaints Neurological: Negative for headache All other ROS negative  ____________________________________________   PHYSICAL EXAM:  VITAL SIGNS: ED Triage Vitals  Enc Vitals Group     BP 08/21/20 0910 138/79     Pulse Rate 08/21/20 0910 83     Resp 08/21/20 0910 16     Temp 08/21/20 0910 97.7 F (36.5 C)     Temp Source 08/21/20 0910 Oral     SpO2 08/21/20 0910 100 %     Weight 08/21/20 0911 118 lb (53.5 kg)     Height 08/21/20 0911 5' (1.524 m)     Head Circumference --      Peak Flow --      Pain Score 08/21/20 0911 8     Pain Loc --  Pain Edu? --      Excl. in [place]? --     Constitutional: Alert and oriented. Well appearing and in no distress. Eyes: Normal exam ENT      Head: Normocephalic and atraumatic.      Mouth/Throat: Mucous membranes are moist. Cardiovascular: Normal rate, regular rhythm. Respiratory: Normal respiratory effort without tachypnea nor retractions. Breath sounds are clear  Gastrointestinal: Soft, mild to moderate left lower quadrant tenderness palpation no rebound guarding or distention.  Abdomen otherwise benign. Musculoskeletal: Nontender with normal range of motion in all extremities. Neurologic:  Normal speech and language. No gross focal neurologic deficits  Skin:  Skin is warm, dry and intact.  Psychiatric: Mood and affect are normal.  ____________________________________________   RADIOLOGY  CT consistent with colitis of the descending colon.  ____________________________________________   INITIAL IMPRESSION / ASSESSMENT AND PLAN / ED COURSE  Pertinent labs & imaging results that were available during my care of the patient were reviewed by me and considered in my medical decision making (see chart for details).   Patient presents to the  emergency department for left lower quadrant abdominal pain and rectal bleeding.  Symptoms have been ongoing for the past 3 days.  Patient has moderate tenderness in the left lower quadrant.  Rectal examination shows mild amount of hematochezia guaiac positive.  Reassuringly patient's labs are normal including a normal hemoglobin.  Normal white blood cell count.  Given the patient's left lower quadrant tenderness to palpation we will proceed with CT imaging the abdomen/pelvis.  Differential would include diverticulitis, colitis, less likely oncologic process.  CT confirms colitis of the descending colon.  Given the patient's bleeding I offered to admit the patient to the hospital for IV antibiotics in fact recommended this.  Patient states she just had a bowel movement thinks the bleeding has stopped.  States she was strongly wished to go home.  As the patient appears reasonable we will place the patient on antibiotics.  I discussed return precautions for any worsening pain, increase in bleeding or development of fever or vomiting.  Patient agreeable to plan of care.  TAMU GOLZ was evaluated in Emergency Department on 08/21/2020 for the symptoms described in the history of present illness. She was evaluated in the context of the global COVID-19 pandemic, which necessitated consideration that the patient might be at risk for infection with the SARS-CoV-2 virus that causes COVID-19. Institutional protocols and algorithms that pertain to the evaluation of patients at risk for COVID-19 are in a state of rapid change based on information released by regulatory bodies including the CDC and federal and state organizations. These policies and algorithms were followed during the patient's care in the ED.  ____________________________________________   FINAL CLINICAL IMPRESSION(S) / ED DIAGNOSES  Left lower quadrant abdominal pain Rectal bleeding Colitis   Harvest Dark, MD 08/21/20 1230

## 2021-02-05 DIAGNOSIS — I10 Essential (primary) hypertension: Secondary | ICD-10-CM | POA: Diagnosis not present

## 2021-02-12 DIAGNOSIS — K219 Gastro-esophageal reflux disease without esophagitis: Secondary | ICD-10-CM | POA: Diagnosis not present

## 2021-02-12 DIAGNOSIS — Z23 Encounter for immunization: Secondary | ICD-10-CM | POA: Diagnosis not present

## 2021-02-12 DIAGNOSIS — F419 Anxiety disorder, unspecified: Secondary | ICD-10-CM | POA: Diagnosis not present

## 2021-02-12 DIAGNOSIS — D333 Benign neoplasm of cranial nerves: Secondary | ICD-10-CM | POA: Diagnosis not present

## 2021-02-12 DIAGNOSIS — M810 Age-related osteoporosis without current pathological fracture: Secondary | ICD-10-CM | POA: Diagnosis not present

## 2021-02-12 DIAGNOSIS — I10 Essential (primary) hypertension: Secondary | ICD-10-CM | POA: Diagnosis not present

## 2021-05-10 DIAGNOSIS — M79675 Pain in left toe(s): Secondary | ICD-10-CM | POA: Diagnosis not present

## 2021-05-22 DIAGNOSIS — S90122A Contusion of left lesser toe(s) without damage to nail, initial encounter: Secondary | ICD-10-CM | POA: Diagnosis not present

## 2021-05-22 DIAGNOSIS — S9032XA Contusion of left foot, initial encounter: Secondary | ICD-10-CM | POA: Diagnosis not present

## 2021-05-22 DIAGNOSIS — M79675 Pain in left toe(s): Secondary | ICD-10-CM | POA: Diagnosis not present

## 2021-05-22 DIAGNOSIS — S90222D Contusion of left lesser toe(s) with damage to nail, subsequent encounter: Secondary | ICD-10-CM | POA: Diagnosis not present

## 2021-05-22 DIAGNOSIS — L600 Ingrowing nail: Secondary | ICD-10-CM | POA: Diagnosis not present

## 2021-06-04 ENCOUNTER — Other Ambulatory Visit: Payer: Self-pay | Admitting: Internal Medicine

## 2021-06-04 DIAGNOSIS — Z1231 Encounter for screening mammogram for malignant neoplasm of breast: Secondary | ICD-10-CM

## 2021-06-25 DIAGNOSIS — B369 Superficial mycosis, unspecified: Secondary | ICD-10-CM | POA: Diagnosis not present

## 2021-07-18 DIAGNOSIS — Z20822 Contact with and (suspected) exposure to covid-19: Secondary | ICD-10-CM | POA: Diagnosis not present

## 2021-07-18 DIAGNOSIS — R059 Cough, unspecified: Secondary | ICD-10-CM | POA: Diagnosis not present

## 2021-07-18 DIAGNOSIS — J019 Acute sinusitis, unspecified: Secondary | ICD-10-CM | POA: Diagnosis not present

## 2021-07-29 DIAGNOSIS — J329 Chronic sinusitis, unspecified: Secondary | ICD-10-CM | POA: Diagnosis not present

## 2021-07-30 ENCOUNTER — Ambulatory Visit
Admission: RE | Admit: 2021-07-30 | Discharge: 2021-07-30 | Disposition: A | Discharge status: Home / Self Care | Payer: PPO | Source: Ambulatory Visit | Attending: Internal Medicine | Admitting: Internal Medicine

## 2021-07-30 ENCOUNTER — Other Ambulatory Visit: Payer: Self-pay

## 2021-07-30 DIAGNOSIS — Z1231 Encounter for screening mammogram for malignant neoplasm of breast: Secondary | ICD-10-CM | POA: Diagnosis not present

## 2021-08-14 DIAGNOSIS — I10 Essential (primary) hypertension: Secondary | ICD-10-CM | POA: Diagnosis not present

## 2021-08-21 DIAGNOSIS — K219 Gastro-esophageal reflux disease without esophagitis: Secondary | ICD-10-CM | POA: Diagnosis not present

## 2021-08-21 DIAGNOSIS — I10 Essential (primary) hypertension: Secondary | ICD-10-CM | POA: Diagnosis not present

## 2021-08-21 DIAGNOSIS — F419 Anxiety disorder, unspecified: Secondary | ICD-10-CM | POA: Diagnosis not present

## 2021-08-21 DIAGNOSIS — Z0000 Encounter for general adult medical examination without abnormal findings: Secondary | ICD-10-CM | POA: Diagnosis not present

## 2021-08-21 DIAGNOSIS — Z0001 Encounter for general adult medical examination with abnormal findings: Secondary | ICD-10-CM | POA: Diagnosis not present

## 2021-09-03 DIAGNOSIS — H04123 Dry eye syndrome of bilateral lacrimal glands: Secondary | ICD-10-CM | POA: Diagnosis not present

## 2021-10-17 DIAGNOSIS — M542 Cervicalgia: Secondary | ICD-10-CM | POA: Diagnosis not present

## 2021-12-17 DIAGNOSIS — M542 Cervicalgia: Secondary | ICD-10-CM | POA: Diagnosis not present

## 2021-12-27 DIAGNOSIS — I10 Essential (primary) hypertension: Secondary | ICD-10-CM | POA: Diagnosis not present

## 2021-12-27 DIAGNOSIS — H6123 Impacted cerumen, bilateral: Secondary | ICD-10-CM | POA: Diagnosis not present

## 2022-01-19 DIAGNOSIS — Z20822 Contact with and (suspected) exposure to covid-19: Secondary | ICD-10-CM | POA: Diagnosis not present

## 2022-01-19 DIAGNOSIS — U071 COVID-19: Secondary | ICD-10-CM | POA: Diagnosis not present

## 2022-01-28 DIAGNOSIS — U071 COVID-19: Secondary | ICD-10-CM | POA: Diagnosis not present

## 2022-02-13 DIAGNOSIS — I10 Essential (primary) hypertension: Secondary | ICD-10-CM | POA: Diagnosis not present

## 2022-02-20 DIAGNOSIS — D649 Anemia, unspecified: Secondary | ICD-10-CM | POA: Diagnosis not present

## 2022-02-20 DIAGNOSIS — F419 Anxiety disorder, unspecified: Secondary | ICD-10-CM | POA: Diagnosis not present

## 2022-02-20 DIAGNOSIS — K219 Gastro-esophageal reflux disease without esophagitis: Secondary | ICD-10-CM | POA: Diagnosis not present

## 2022-02-20 DIAGNOSIS — Z23 Encounter for immunization: Secondary | ICD-10-CM | POA: Diagnosis not present

## 2022-02-20 DIAGNOSIS — I10 Essential (primary) hypertension: Secondary | ICD-10-CM | POA: Diagnosis not present

## 2022-02-20 DIAGNOSIS — M5489 Other dorsalgia: Secondary | ICD-10-CM | POA: Diagnosis not present

## 2022-02-20 DIAGNOSIS — M810 Age-related osteoporosis without current pathological fracture: Secondary | ICD-10-CM | POA: Diagnosis not present

## 2022-02-24 DIAGNOSIS — D649 Anemia, unspecified: Secondary | ICD-10-CM | POA: Diagnosis not present

## 2022-02-24 DIAGNOSIS — Z1211 Encounter for screening for malignant neoplasm of colon: Secondary | ICD-10-CM | POA: Diagnosis not present

## 2022-03-07 DIAGNOSIS — N390 Urinary tract infection, site not specified: Secondary | ICD-10-CM | POA: Diagnosis not present

## 2022-03-07 DIAGNOSIS — R509 Fever, unspecified: Secondary | ICD-10-CM | POA: Diagnosis not present

## 2022-03-07 DIAGNOSIS — J069 Acute upper respiratory infection, unspecified: Secondary | ICD-10-CM | POA: Diagnosis not present

## 2022-03-07 DIAGNOSIS — Z20822 Contact with and (suspected) exposure to covid-19: Secondary | ICD-10-CM | POA: Diagnosis not present

## 2022-03-07 DIAGNOSIS — R300 Dysuria: Secondary | ICD-10-CM | POA: Diagnosis not present

## 2022-03-28 DIAGNOSIS — M79605 Pain in left leg: Secondary | ICD-10-CM | POA: Diagnosis not present

## 2022-03-28 DIAGNOSIS — M5136 Other intervertebral disc degeneration, lumbar region: Secondary | ICD-10-CM | POA: Diagnosis not present

## 2022-03-28 DIAGNOSIS — M545 Low back pain, unspecified: Secondary | ICD-10-CM | POA: Diagnosis not present

## 2022-03-28 DIAGNOSIS — M5416 Radiculopathy, lumbar region: Secondary | ICD-10-CM | POA: Diagnosis not present

## 2022-03-28 DIAGNOSIS — M5137 Other intervertebral disc degeneration, lumbosacral region: Secondary | ICD-10-CM | POA: Diagnosis not present

## 2022-04-28 DIAGNOSIS — M5442 Lumbago with sciatica, left side: Secondary | ICD-10-CM | POA: Diagnosis not present

## 2022-04-28 DIAGNOSIS — G8929 Other chronic pain: Secondary | ICD-10-CM | POA: Diagnosis not present

## 2022-05-08 DIAGNOSIS — G8929 Other chronic pain: Secondary | ICD-10-CM | POA: Diagnosis not present

## 2022-05-08 DIAGNOSIS — M5442 Lumbago with sciatica, left side: Secondary | ICD-10-CM | POA: Diagnosis not present

## 2022-05-12 DIAGNOSIS — R059 Cough, unspecified: Secondary | ICD-10-CM | POA: Diagnosis not present

## 2022-05-12 DIAGNOSIS — J069 Acute upper respiratory infection, unspecified: Secondary | ICD-10-CM | POA: Diagnosis not present

## 2022-05-12 DIAGNOSIS — Z20822 Contact with and (suspected) exposure to covid-19: Secondary | ICD-10-CM | POA: Diagnosis not present

## 2022-05-23 DIAGNOSIS — G8929 Other chronic pain: Secondary | ICD-10-CM | POA: Diagnosis not present

## 2022-05-23 DIAGNOSIS — M5416 Radiculopathy, lumbar region: Secondary | ICD-10-CM | POA: Diagnosis not present

## 2022-05-23 DIAGNOSIS — M5136 Other intervertebral disc degeneration, lumbar region: Secondary | ICD-10-CM | POA: Diagnosis not present

## 2022-05-23 DIAGNOSIS — M5442 Lumbago with sciatica, left side: Secondary | ICD-10-CM | POA: Diagnosis not present

## 2022-05-28 DIAGNOSIS — I10 Essential (primary) hypertension: Secondary | ICD-10-CM | POA: Diagnosis not present

## 2022-05-28 DIAGNOSIS — H6992 Unspecified Eustachian tube disorder, left ear: Secondary | ICD-10-CM | POA: Diagnosis not present

## 2022-06-20 ENCOUNTER — Other Ambulatory Visit: Payer: Self-pay | Admitting: Internal Medicine

## 2022-06-20 DIAGNOSIS — Z1231 Encounter for screening mammogram for malignant neoplasm of breast: Secondary | ICD-10-CM

## 2022-08-01 ENCOUNTER — Ambulatory Visit
Admission: RE | Admit: 2022-08-01 | Discharge: 2022-08-01 | Disposition: A | Discharge status: Home / Self Care | Payer: PPO | Source: Ambulatory Visit | Attending: Internal Medicine | Admitting: Internal Medicine

## 2022-08-01 DIAGNOSIS — Z1231 Encounter for screening mammogram for malignant neoplasm of breast: Secondary | ICD-10-CM | POA: Diagnosis not present

## 2022-08-20 DIAGNOSIS — I10 Essential (primary) hypertension: Secondary | ICD-10-CM | POA: Diagnosis not present

## 2022-09-15 DIAGNOSIS — I10 Essential (primary) hypertension: Secondary | ICD-10-CM | POA: Diagnosis not present

## 2022-09-15 DIAGNOSIS — Z0000 Encounter for general adult medical examination without abnormal findings: Secondary | ICD-10-CM | POA: Diagnosis not present

## 2022-09-15 DIAGNOSIS — F419 Anxiety disorder, unspecified: Secondary | ICD-10-CM | POA: Diagnosis not present

## 2022-09-15 DIAGNOSIS — Z0001 Encounter for general adult medical examination with abnormal findings: Secondary | ICD-10-CM | POA: Diagnosis not present

## 2022-09-15 DIAGNOSIS — M810 Age-related osteoporosis without current pathological fracture: Secondary | ICD-10-CM | POA: Diagnosis not present

## 2022-09-15 DIAGNOSIS — K219 Gastro-esophageal reflux disease without esophagitis: Secondary | ICD-10-CM | POA: Diagnosis not present

## 2023-02-12 IMAGING — MG MM DIGITAL SCREENING BILAT W/ TOMO AND CAD
8 series · 9 of 24 positions shown · non-contrast
Comparison: Previous exam(s).

ACR Breast Density Category a: The breast tissue is almost entirely
fatty.

CLINICAL DATA: Screening.

EXAM:
DIGITAL SCREENING BILATERAL MAMMOGRAM WITH TOMOSYNTHESIS AND CAD
TECHNIQUE: Bilateral screening digital craniocaudal and mediolateral oblique
mammograms were obtained. Bilateral screening digital breast
tomosynthesis was performed. The images were evaluated with
computer-aided detection.

[L MLO synth-2D]
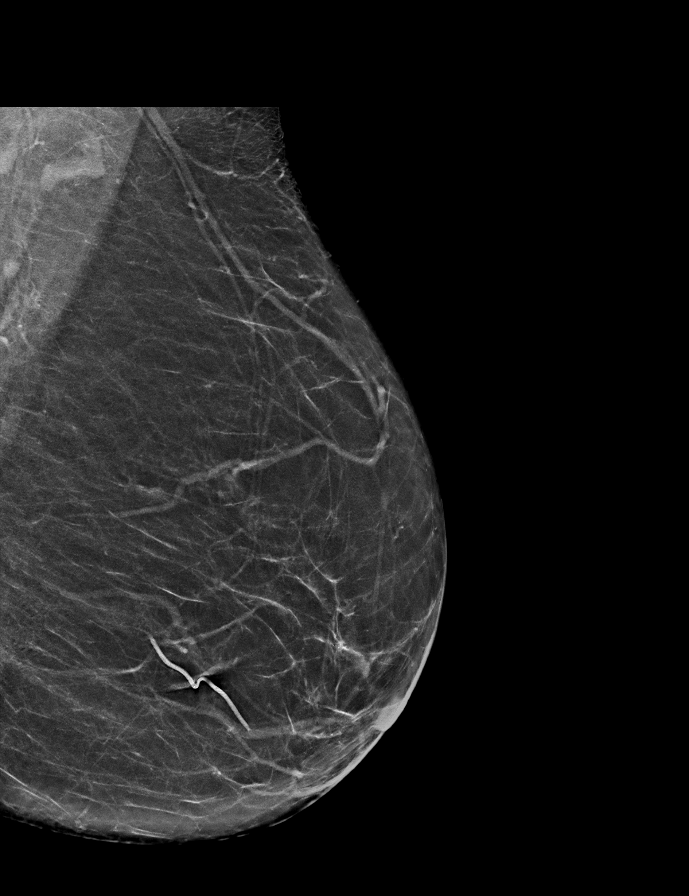

[R MLO synth-2D]
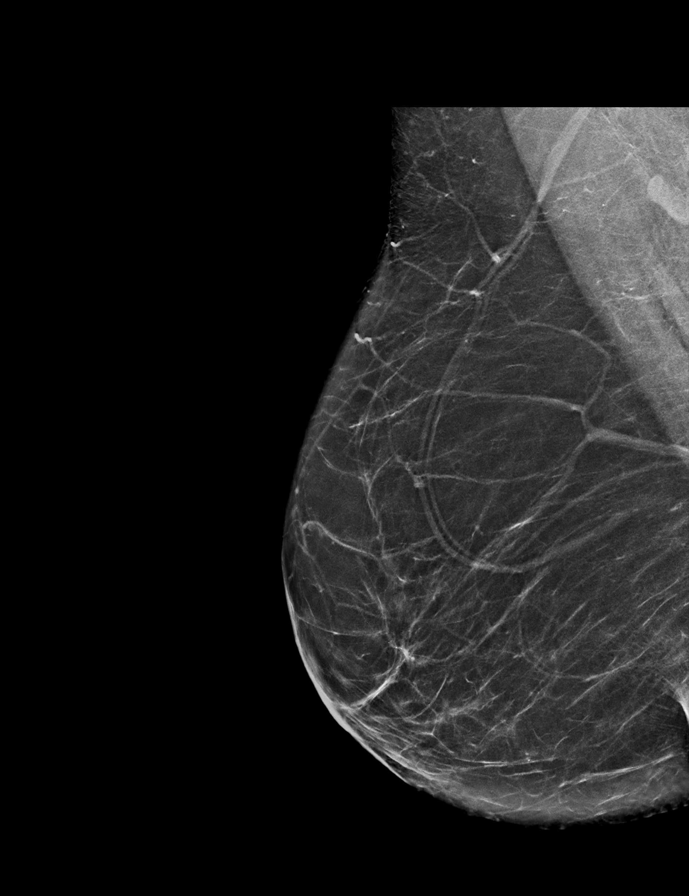

[R CC synth-2D]
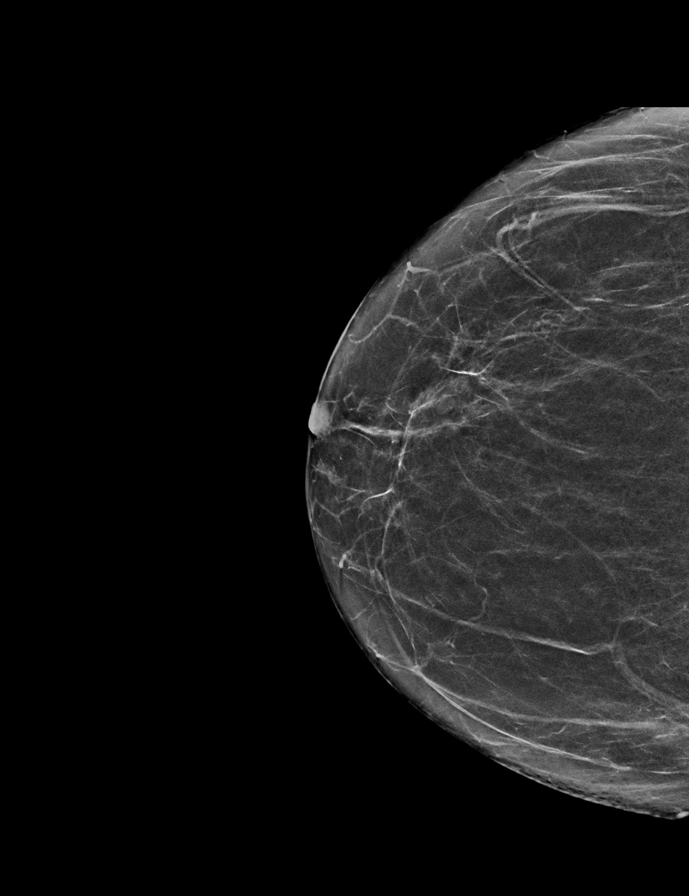

[L CC synth-2D]
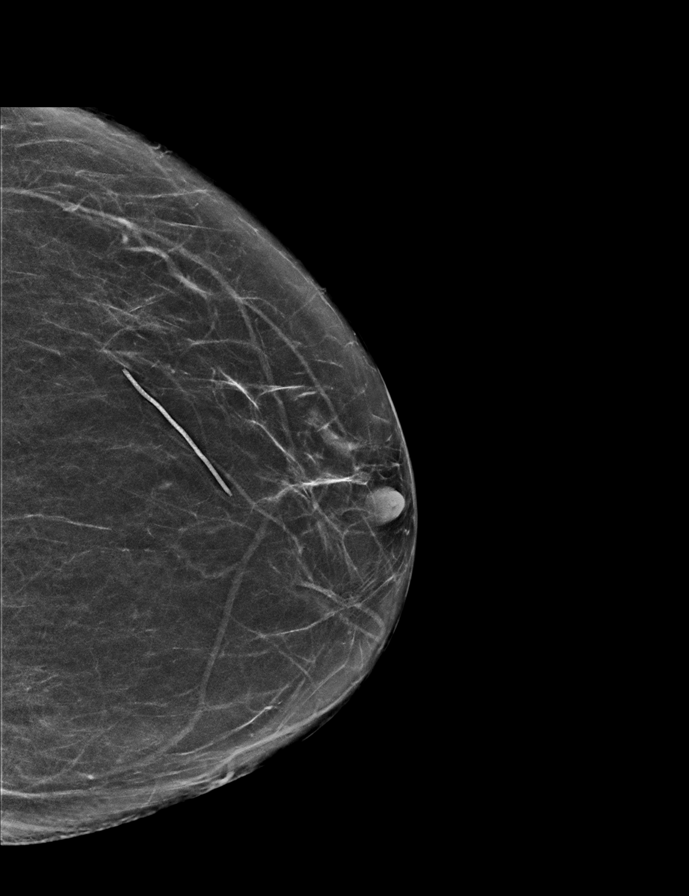

[R MLO tomo · 2 of 68 frames shown]
[frame 22/68]
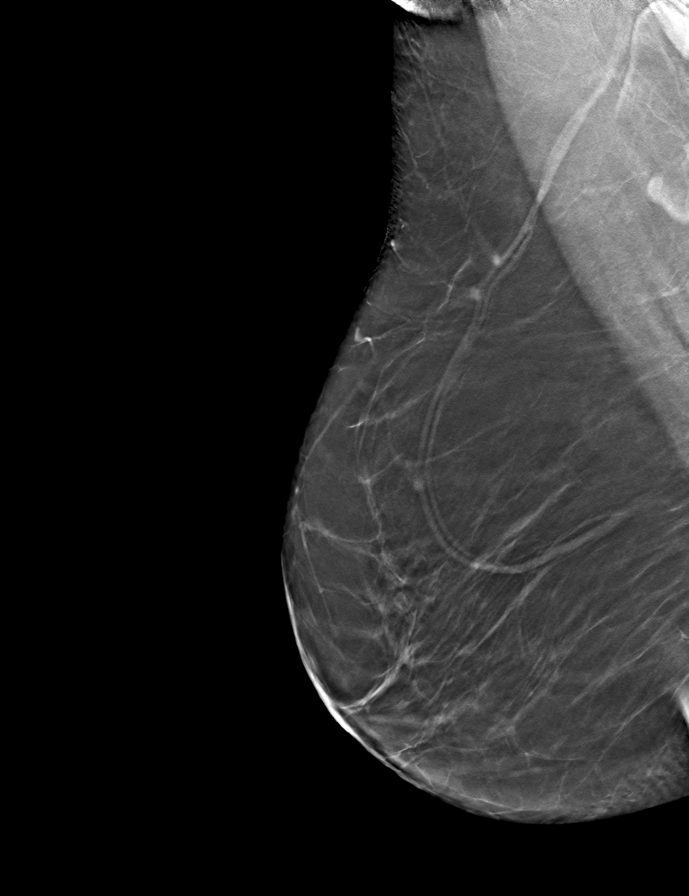
[frame 35/68]
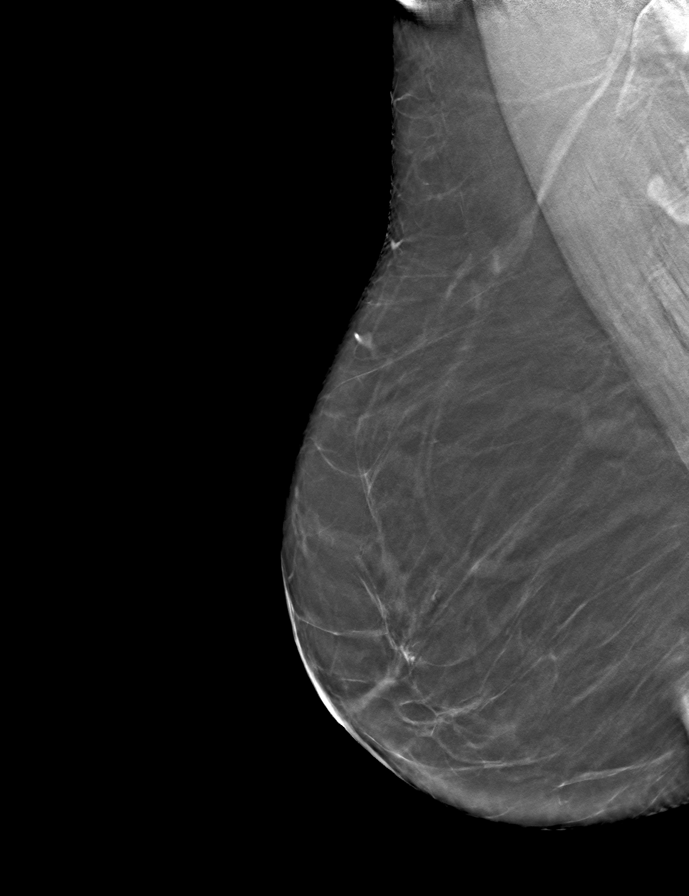

[L MLO tomo · tomo slice 33/64.0]
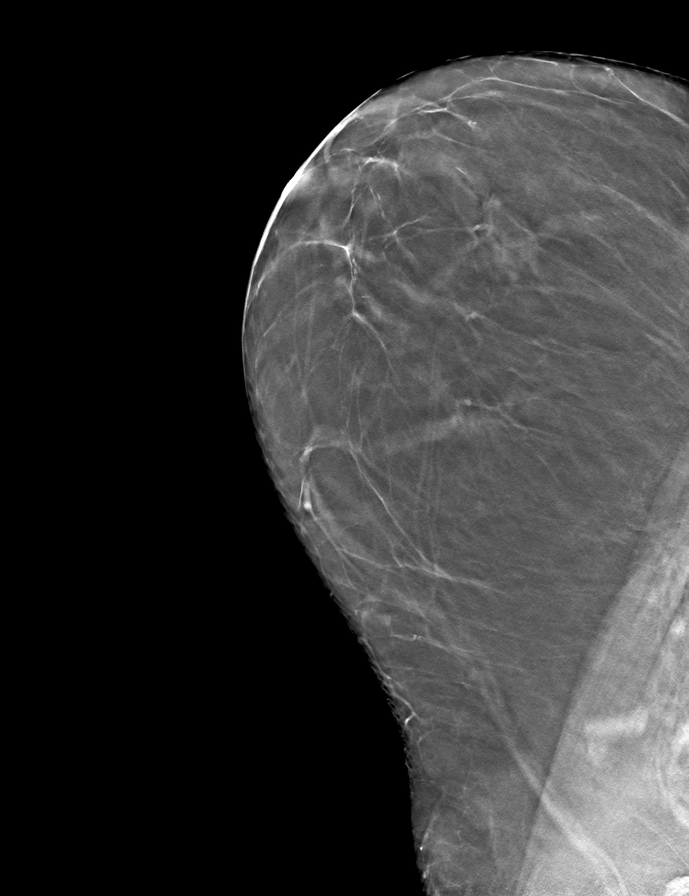

[R CC tomo · tomo slice 27/54.0]
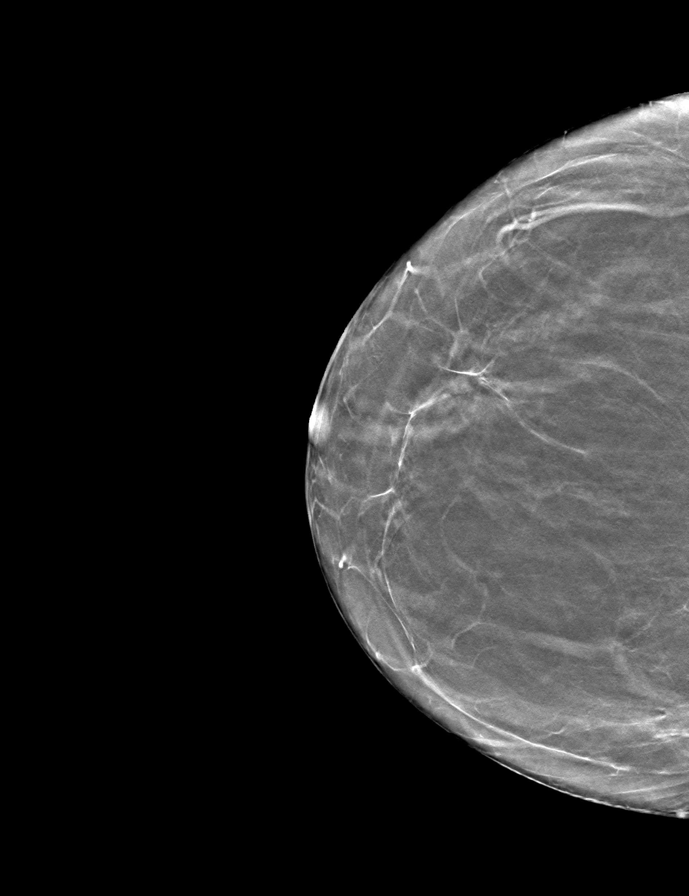

[L CC tomo · tomo slice 29/57.0]
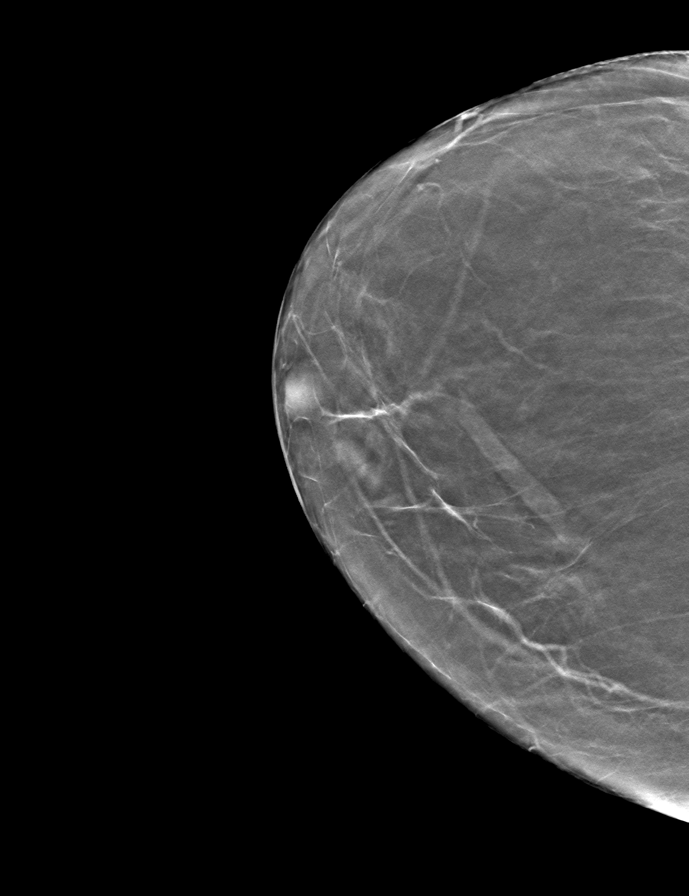

[9 of 24 positions shown; findings below may reference images not displayed]

FINDINGS: There are no findings suspicious for malignancy.
IMPRESSION: No mammographic evidence of malignancy. A result letter of this
screening mammogram will be mailed directly to the patient.

RECOMMENDATION:
Screening mammogram in one year. (Code:0E-3-N98)

BI-RADS CATEGORY  1: Negative.

## 2023-03-11 DIAGNOSIS — I10 Essential (primary) hypertension: Secondary | ICD-10-CM | POA: Diagnosis not present

## 2023-03-18 DIAGNOSIS — D649 Anemia, unspecified: Secondary | ICD-10-CM | POA: Diagnosis not present

## 2023-03-18 DIAGNOSIS — I10 Essential (primary) hypertension: Secondary | ICD-10-CM | POA: Diagnosis not present

## 2023-03-18 DIAGNOSIS — F419 Anxiety disorder, unspecified: Secondary | ICD-10-CM | POA: Diagnosis not present

## 2023-03-18 DIAGNOSIS — K219 Gastro-esophageal reflux disease without esophagitis: Secondary | ICD-10-CM | POA: Diagnosis not present

## 2023-03-18 DIAGNOSIS — M810 Age-related osteoporosis without current pathological fracture: Secondary | ICD-10-CM | POA: Diagnosis not present

## 2023-03-24 ENCOUNTER — Encounter: Payer: Self-pay | Admitting: Oncology

## 2023-03-24 ENCOUNTER — Inpatient Hospital Stay: Payer: PPO

## 2023-03-24 ENCOUNTER — Inpatient Hospital Stay: Payer: PPO | Attending: Oncology | Admitting: Oncology

## 2023-03-24 VITALS — BP 162/59 | HR 74 | Temp 97.4°F | Resp 16 | Ht 60.0 in | Wt 130.4 lb

## 2023-03-24 DIAGNOSIS — D75839 Thrombocytosis, unspecified: Secondary | ICD-10-CM | POA: Insufficient documentation

## 2023-03-24 DIAGNOSIS — D509 Iron deficiency anemia, unspecified: Secondary | ICD-10-CM

## 2023-03-24 DIAGNOSIS — I10 Essential (primary) hypertension: Secondary | ICD-10-CM | POA: Insufficient documentation

## 2023-03-24 DIAGNOSIS — Z803 Family history of malignant neoplasm of breast: Secondary | ICD-10-CM | POA: Insufficient documentation

## 2023-03-24 LAB — FOLATE: Folate: 39 ng/mL (ref >5.9)

## 2023-03-24 LAB — CBC (CANCER CENTER ONLY)
HCT: 32.4 % — ABNORMAL LOW (ref 36.0–46.0)
Hemoglobin: 9.3 g/dL — ABNORMAL LOW (ref 12.0–15.0)
MCH: 21.1 pg — ABNORMAL LOW (ref 26.0–34.0)
MCHC: 28.7 g/dL — ABNORMAL LOW (ref 30.0–36.0)
MCV: 73.5 fL — ABNORMAL LOW (ref 80.0–100.0)
Platelet Count: 496 10*3/uL — ABNORMAL HIGH (ref 150–400)
RBC: 4.41 MIL/uL (ref 3.87–5.11)
RDW: 15.6 % — ABNORMAL HIGH (ref 11.5–15.5)
WBC Count: 6.6 10*3/uL (ref 4.0–10.5)
nRBC: 0 % (ref 0.0–0.2)

## 2023-03-24 LAB — IRON AND TIBC
Iron: 26 ug/dL — ABNORMAL LOW (ref 28–170)
Saturation Ratios: 5 % — ABNORMAL LOW (ref 10.4–31.8)
TIBC: 547 ug/dL — ABNORMAL HIGH (ref 250–450)
UIBC: 521 ug/dL

## 2023-03-24 LAB — FERRITIN: Ferritin: 4 ng/mL — ABNORMAL LOW (ref 11–307)

## 2023-03-24 LAB — LACTATE DEHYDROGENASE: LDH: 133 U/L (ref 98–192)

## 2023-03-24 NOTE — Progress Notes (Signed)
[facility]  Telephone:([phone]) [phone] [phone]) [phone]  ID: Brittany Alvarez OB: [birth year]  MR#: [id number]  AOZ#:[id number]  Patient Care Team: Brittany Nurse, MD as PCP - General (Internal Medicine)  CHIEF COMPLAINT: Iron deficiency anemia.  INTERVAL HISTORY: Patient is a 77 year old female who was noted to have a declining hemoglobin on routine blood work.  Iron stores from previously appeared to be decreased as well.  Patient reports she is weak, fatigued, and "always cold".  She also has mild dyspnea on exertion.  She otherwise feels well.  She has no neurologic complaints.  She denies any recent fevers or illnesses.  She has a good appetite and denies weight loss.  She has no chest pain, shortness of breath, cough, or hemoptysis.  She denies any nausea, vomiting, constipation, or diarrhea.  She has no melena or hematochezia.  She has no urinary complaints.  Patient offers no further specific complaints today.  REVIEW OF SYSTEMS:   Review of Systems  Constitutional:  Positive for malaise/fatigue. Negative for fever and weight loss.  Respiratory: Negative.  Negative for cough, hemoptysis and shortness of breath.   Cardiovascular: Negative.  Negative for chest pain and leg swelling.  Gastrointestinal: Negative.  Negative for blood in stool, constipation, melena and nausea.  Genitourinary: Negative.  Negative for hematuria.  Musculoskeletal: Negative.  Negative for back pain.  Skin: Negative.  Negative for rash.  Neurological:  Positive for weakness. Negative for dizziness, focal weakness and headaches.  Psychiatric/Behavioral: Negative.  The patient is not nervous/anxious.     As per HPI. Otherwise, a complete review of systems is negative.  PAST MEDICAL HISTORY: Past Medical History:  Diagnosis Date   Anemia    Anxiety    Arthritis    right hand   Blood transfusion    approx. 40 years ago   Deaf, right    Gallstones    hx of   GERD (gastroesophageal  reflux disease)    Hyperlipidemia    Hypertension    sees Dr. Judithann Alvarez in [place], [place] [phone]    PAST SURGICAL HISTORY: Past Surgical History:  Procedure Laterality Date   ANTERIOR LAT LUMBAR FUSION  06/24/2011   Procedure: ANTERIOR LATERAL LUMBAR FUSION 1 LEVEL;  Surgeon: Reinaldo Meeker, MD;  Location: [facility] NEURO ORS;  Service: Neurosurgery;  Laterality: Right;  Right Lumbar Three-Four Anterior Lateral Lumbar Interbody Fusion with Right Lumbar Three-Four percutaneous pedicle screws (Lateral Postion for both procedures)   APPENDECTOMY     BACK SURGERY     x 2 (rods and screws in lumbar region)   BREAST CYST ASPIRATION Left 1990   NEG   BREAST EXCISIONAL BIOPSY Left age 3's   NEG   CATARACT EXTRACTION W/PHACO Left 08/01/2019   Procedure: CATARACT EXTRACTION PHACO AND INTRAOCULAR LENS PLACEMENT (IOC) LEFT;  Surgeon: Nevada Crane, MD;  Location: [facility];  Service: Ophthalmology;  Laterality: Left;  CDE 16.0 U/S 1:29.4   CATARACT EXTRACTION W/PHACO Right 08/22/2019   Procedure: CATARACT EXTRACTION PHACO AND INTRAOCULAR LENS PLACEMENT (IOC) RIGHT;  Surgeon: Nevada Crane, MD;  Location: [facility];  Service: Ophthalmology;  Laterality: Right;  1.98 0:25.2   CHOLECYSTECTOMY     COLONOSCOPY WITH PROPOFOL N/A 01/15/2016   Procedure: COLONOSCOPY WITH PROPOFOL;  Surgeon: Christena Deem, MD;  Location: [facility] ENDOSCOPY;  Service: Endoscopy;  Laterality: N/A;   COLONOSCOPY WITH PROPOFOL N/A 04/04/2019   Procedure: COLONOSCOPY WITH PROPOFOL;  Surgeon: Toledo, Boykin Nearing, MD;  Location: [facility] ENDOSCOPY;  Service: Gastroenterology;  Laterality: N/A;   OVARY SURGERY     removal of left and left tube   TONSILLECTOMY     vestibular tumor resection Right 2016   schwannoma    FAMILY HISTORY: Family History  Problem Relation Age of Onset   COPD Father    Breast cancer Mother 46   Heart failure Mother     ADVANCED DIRECTIVES (Y/N):  N  HEALTH MAINTENANCE: Social  History   Tobacco Use   Smoking status: Never   Smokeless tobacco: Never   Tobacco comments:    smoking cessation materials not required  Vaping Use   Vaping status: Never Used  Substance Use Topics   Alcohol use: Yes    Alcohol/week: 0.0 standard drinks of alcohol    Comment: occ - 1x/month   Drug use: No     Colonoscopy:  PAP:  Bone density:  Lipid panel:  Allergies  Allergen Reactions   Povidone Iodine Rash   Latex Hives    Patient states painful and rash, (only reaction was after back surgery when betadine was used. Unsure if Latex or betadine issue)   Metolazone Other (See Comments)    Hypercalcemia   Thiazide-Type Diuretics     Hypercalcemia   Diflucan [Fluconazole] Rash    Possible rash that occurred one week after completing one week of diflucan    Current Outpatient Medications  Medication Sig Dispense Refill   alendronate (FOSAMAX) 70 MG tablet TAKE ONE TABLET BY MOUTH EACH WEEK, ON AN EMPTY STOMACH BEFORE BREAKFAST WITH 8oz OF WATER AND REMAIN UPRIGHT FOR :30 4 tablet 12   amLODipine (NORVASC) 2.5 MG tablet Take 1 tablet by mouth daily.     aspirin 81 MG tablet Take 81 mg by mouth daily.     busPIRone (BUSPAR) 10 MG tablet TAKE 1 TABLET BY MOUTH TWICE DAILY 60 tablet 5   Cholecalciferol (VITAMIN D3 SUPER STRENGTH) 50 MCG (2000 UT) CAPS Take 1 capsule by mouth daily.     furosemide (LASIX) 20 MG tablet TAKE 1 TABLET BY MOUTH ONCE DAILY 30 tablet 12   lisinopril (ZESTRIL) 20 MG tablet TAKE 1 TABLET BY MOUTH ONCE DAILY (Patient taking differently: 40 mg.) 30 tablet 5   loratadine (CLARITIN) 10 MG tablet Take 10 mg by mouth daily. otc     lovastatin (MEVACOR) 20 MG tablet TAKE 2 TABLETS BY MOUTH AT BEDTIME AS DIRECTED 60 tablet 12   Nutritional Supplements (NUTRITIONAL SUPPLEMENT PO) Take 1 capsule by mouth daily. Reports taking Cognium 100 mg once daily     Probiotic Product (PROBIOTIC ADVANCED PO) Take by mouth.     tiZANidine (ZANAFLEX) 4 MG tablet TAKE 1  TABLET BY MOUTH EVERY 8 HOURS AS NEEDED FOR MUSCLE SPASMS 90 tablet 12   vitamin B-12 (CYANOCOBALAMIN) 100 MCG tablet Take 100 mcg by mouth daily.     No current facility-administered medications for this visit.    OBJECTIVE: Vitals:   03/24/23 1054  BP: (!) 162/59  Pulse: 74  Resp: 16  Temp: (!) 97.4 F (36.3 C)  SpO2: 100%     Body mass index is 25.47 kg/m.    ECOG FS:1 - Symptomatic but completely ambulatory  General: Well-developed, well-nourished, no acute distress. Eyes: Pink conjunctiva, anicteric sclera. HEENT: Normocephalic, moist mucous membranes. Lungs: No audible wheezing or coughing. Heart: Regular rate and rhythm. Abdomen: Soft, nontender, no obvious distention. Musculoskeletal: No edema, cyanosis, or clubbing. Neuro: Alert, answering all questions appropriately. Cranial nerves grossly intact. Skin: No  rashes or petechiae noted. Psych: Normal affect. Lymphatics: No cervical, calvicular, axillary or inguinal LAD.   LAB RESULTS:  Lab Results  Component Value Date   NA 137 08/21/2020   K 3.9 08/21/2020   CL 103 08/21/2020   CO2 25 08/21/2020   GLUCOSE 104 (H) 08/21/2020   BUN 20 08/21/2020   CREATININE 0.87 08/21/2020   CALCIUM 9.5 08/21/2020   PROT 7.3 08/21/2020   ALBUMIN 4.3 08/21/2020   AST 23 08/21/2020   ALT 13 08/21/2020   ALKPHOS 57 08/21/2020   BILITOT 0.8 08/21/2020   GFRNONAA >60 08/21/2020   GFRAA 65 04/20/2019    Lab Results  Component Value Date   WBC 6.6 03/24/2023   NEUTROABS 4.0 04/20/2019   HGB 9.3 (L) 03/24/2023   HCT 32.4 (L) 03/24/2023   MCV 73.5 (L) 03/24/2023   PLT 496 (H) 03/24/2023     STUDIES: No results found.  ASSESSMENT: Iron deficiency anemia.  PLAN:    Iron deficiency anemia: Patient did have a decreased hemoglobin of 9.3 with a reduced MCV of 73.5.  She does not appear to have any hemolysis.  The remainder of her laboratory work including iron stores, B12, folate, and SPEP are pending at time of  dictation.  Patient's last colonoscopy was in November 2020.  Return to clinic in 2 weeks for further evaluation, discussion of her laboratory results, and IV Venofer if necessary. Thrombocytosis: Likely reactive, monitor. Hypertension: Patient blood pressure is moderately elevated today.  Continue evaluation and treatment per primary care.  I spent a total of 45 minutes reviewing chart data, face-to-face evaluation with the patient, counseling and coordination of care as detailed above.   Patient expressed understanding and was in agreement with this plan. She also understands that She can call clinic at any time with any questions, concerns, or complaints.     Jeralyn Ruths, MD   03/24/2023 2:22 PM

## 2023-03-25 ENCOUNTER — Other Ambulatory Visit: Payer: Self-pay | Admitting: Oncology

## 2023-03-25 LAB — PROTEIN ELECTROPHORESIS, SERUM
A/G Ratio: 1.2 (ref 0.7–1.7)
Albumin ELP: 3.9 g/dL (ref 2.9–4.4)
Alpha-1-Globulin: 0.3 g/dL (ref 0.0–0.4)
Alpha-2-Globulin: 0.9 g/dL (ref 0.4–1.0)
Beta Globulin: 1.2 g/dL (ref 0.7–1.3)
Gamma Globulin: 0.8 g/dL (ref 0.4–1.8)
Globulin, Total: 3.2 g/dL (ref 2.2–3.9)
Total Protein ELP: 7.1 g/dL (ref 6.0–8.5)

## 2023-03-25 LAB — VITAMIN B12: Vitamin B-12: 153 pg/mL — ABNORMAL LOW (ref 180–914)

## 2023-03-25 LAB — HAPTOGLOBIN: Haptoglobin: 206 mg/dL (ref 42–346)

## 2023-03-31 ENCOUNTER — Telehealth: Payer: Self-pay

## 2023-03-31 NOTE — Telephone Encounter (Signed)
error 

## 2023-04-07 ENCOUNTER — Encounter: Payer: Self-pay | Admitting: Oncology

## 2023-04-07 ENCOUNTER — Inpatient Hospital Stay: Payer: PPO | Attending: Oncology | Admitting: Oncology

## 2023-04-07 ENCOUNTER — Inpatient Hospital Stay: Payer: PPO

## 2023-04-07 VITALS — BP 183/73 | HR 74 | Resp 16

## 2023-04-07 VITALS — BP 182/63 | HR 73 | Temp 97.4°F | Resp 16 | Ht 60.0 in | Wt 128.0 lb

## 2023-04-07 DIAGNOSIS — D509 Iron deficiency anemia, unspecified: Secondary | ICD-10-CM | POA: Insufficient documentation

## 2023-04-07 DIAGNOSIS — E538 Deficiency of other specified B group vitamins: Secondary | ICD-10-CM | POA: Insufficient documentation

## 2023-04-07 DIAGNOSIS — Z79899 Other long term (current) drug therapy: Secondary | ICD-10-CM | POA: Insufficient documentation

## 2023-04-07 MED ORDER — CYANOCOBALAMIN 1000 MCG/ML IJ SOLN
1000.0000 ug | Freq: Once | INTRAMUSCULAR | Status: AC
  Administered 2023-04-07: 1000 ug via INTRAMUSCULAR
  Filled 2023-04-07: qty 1

## 2023-04-07 MED ORDER — IRON SUCROSE 20 MG/ML IV SOLN
200.0000 mg | Freq: Once | INTRAVENOUS | Status: AC
  Administered 2023-04-07: 200 mg via INTRAVENOUS
  Filled 2023-04-07: qty 10

## 2023-04-07 NOTE — Progress Notes (Signed)
[facility]  Telephone:([phone] Fax:(336) [phone]  ID: TAMESA DALLIS OB: [birth year]  MR#: [id number]  AOZ#:[id number]  Patient Care Team: Gracelyn Nurse, MD as PCP - General (Internal Medicine)  CHIEF COMPLAINT: Iron deficiency anemia, B12 deficiency.  INTERVAL HISTORY: Patient returns to clinic today for further evaluation and initiation of IV Venofer and B12 injections.  She continues to have weakness and fatigue.  She also complains of being "always cold".  She has no neurologic complaints.  She denies any recent fevers or illnesses.  She has a good appetite and denies weight loss.  She has no chest pain, shortness of breath, cough, or hemoptysis.  She denies any nausea, vomiting, constipation, or diarrhea.  She has no melena or hematochezia.  She has no urinary complaints.  Patient offers no further specific complaints today.  REVIEW OF SYSTEMS:   Review of Systems  Constitutional:  Positive for malaise/fatigue. Negative for fever and weight loss.  Respiratory: Negative.  Negative for cough, hemoptysis and shortness of breath.   Cardiovascular: Negative.  Negative for chest pain and leg swelling.  Gastrointestinal: Negative.  Negative for blood in stool, constipation, melena and nausea.  Genitourinary: Negative.  Negative for hematuria.  Musculoskeletal: Negative.  Negative for back pain.  Skin: Negative.  Negative for rash.  Neurological:  Positive for weakness. Negative for dizziness, focal weakness and headaches.  Psychiatric/Behavioral: Negative.  The patient is not nervous/anxious.     As per HPI. Otherwise, a complete review of systems is negative.  PAST MEDICAL HISTORY: Past Medical History:  Diagnosis Date   Anemia    Anxiety    Arthritis    right hand   Blood transfusion    approx. 40 years ago   Deaf, right    Gallstones    hx of   GERD (gastroesophageal reflux disease)    Hyperlipidemia    Hypertension    sees Dr. Judithann Graves in  [place], [place] [phone]    PAST SURGICAL HISTORY: Past Surgical History:  Procedure Laterality Date   ANTERIOR LAT LUMBAR FUSION  06/24/2011   Procedure: ANTERIOR LATERAL LUMBAR FUSION 1 LEVEL;  Surgeon: Reinaldo Meeker, MD;  Location: [facility];  Service: Neurosurgery;  Laterality: Right;  Right Lumbar Three-Four Anterior Lateral Lumbar Interbody Fusion with Right Lumbar Three-Four percutaneous pedicle screws (Lateral Postion for both procedures)   APPENDECTOMY     BACK SURGERY     x 2 (rods and screws in lumbar region)   BREAST CYST ASPIRATION Left 1990   NEG   BREAST EXCISIONAL BIOPSY Left age 68's   NEG   CATARACT EXTRACTION W/PHACO Left 08/01/2019   Procedure: CATARACT EXTRACTION PHACO AND INTRAOCULAR LENS PLACEMENT (IOC) LEFT;  Surgeon: Nevada Crane, MD;  Location: [facility];  Service: Ophthalmology;  Laterality: Left;  CDE 16.0 U/S 1:29.4   CATARACT EXTRACTION W/PHACO Right 08/22/2019   Procedure: CATARACT EXTRACTION PHACO AND INTRAOCULAR LENS PLACEMENT (IOC) RIGHT;  Surgeon: Nevada Crane, MD;  Location: [facility];  Service: Ophthalmology;  Laterality: Right;  1.98 0:25.2   CHOLECYSTECTOMY     COLONOSCOPY WITH PROPOFOL N/A 01/15/2016   Procedure: COLONOSCOPY WITH PROPOFOL;  Surgeon: Christena Deem, MD;  Location: [facility];  Service: Endoscopy;  Laterality: N/A;   COLONOSCOPY WITH PROPOFOL N/A 04/04/2019   Procedure: COLONOSCOPY WITH PROPOFOL;  Surgeon: Toledo, Boykin Nearing, MD;  Location: [facility] ENDOSCOPY;  Service: Gastroenterology;  Laterality: N/A;   OVARY SURGERY     removal of left  and left tube   TONSILLECTOMY     vestibular tumor resection Right 2016   schwannoma    FAMILY HISTORY: Family History  Problem Relation Age of Onset   COPD Father    Breast cancer Mother 60   Heart failure Mother     ADVANCED DIRECTIVES (Y/N):  N  HEALTH MAINTENANCE: Social History   Tobacco Use   Smoking status: Never   Smokeless tobacco: Never    Tobacco comments:    smoking cessation materials not required  Vaping Use   Vaping status: Never Used  Substance Use Topics   Alcohol use: Yes    Alcohol/week: 0.0 standard drinks of alcohol    Comment: occ - 1x/month   Drug use: No     Colonoscopy:  PAP:  Bone density:  Lipid panel:  Allergies  Allergen Reactions   Povidone Iodine Rash   Latex Hives    Patient states painful and rash, (only reaction was after back surgery when betadine was used. Unsure if Latex or betadine issue)   Metolazone Other (See Comments)    Hypercalcemia   Thiazide-Type Diuretics     Hypercalcemia   Diflucan [Fluconazole] Rash    Possible rash that occurred one week after completing one week of diflucan    Current Outpatient Medications  Medication Sig Dispense Refill   alendronate (FOSAMAX) 70 MG tablet TAKE ONE TABLET BY MOUTH EACH WEEK, ON AN EMPTY STOMACH BEFORE BREAKFAST WITH 8oz OF WATER AND REMAIN UPRIGHT FOR :30 4 tablet 12   amLODipine (NORVASC) 2.5 MG tablet Take 1 tablet by mouth daily.     aspirin 81 MG tablet Take 81 mg by mouth daily.     busPIRone (BUSPAR) 10 MG tablet TAKE 1 TABLET BY MOUTH TWICE DAILY 60 tablet 5   Cholecalciferol (VITAMIN D3 SUPER STRENGTH) 50 MCG (2000 UT) CAPS Take 1 capsule by mouth daily.     furosemide (LASIX) 20 MG tablet TAKE 1 TABLET BY MOUTH ONCE DAILY 30 tablet 12   lisinopril (ZESTRIL) 20 MG tablet TAKE 1 TABLET BY MOUTH ONCE DAILY (Patient taking differently: 40 mg.) 30 tablet 5   loratadine (CLARITIN) 10 MG tablet Take 10 mg by mouth daily. otc     lovastatin (MEVACOR) 20 MG tablet TAKE 2 TABLETS BY MOUTH AT BEDTIME AS DIRECTED 60 tablet 12   Nutritional Supplements (NUTRITIONAL SUPPLEMENT PO) Take 1 capsule by mouth daily. Reports taking Cognium 100 mg once daily     Probiotic Product (PROBIOTIC ADVANCED PO) Take by mouth.     tiZANidine (ZANAFLEX) 4 MG tablet TAKE 1 TABLET BY MOUTH EVERY 8 HOURS AS NEEDED FOR MUSCLE SPASMS 90 tablet 12   vitamin  B-12 (CYANOCOBALAMIN) 100 MCG tablet Take 100 mcg by mouth daily.     No current facility-administered medications for this visit.    OBJECTIVE: Vitals:   04/07/23 1359 04/07/23 1402  BP: (!) 203/83 (!) 182/63  Pulse: 73   Resp: 16   Temp: (!) 97.4 F (36.3 C)   SpO2: 100%      Body mass index is 25 kg/m.    ECOG FS:1 - Symptomatic but completely ambulatory  General: Well-developed, well-nourished, no acute distress. Eyes: Pink conjunctiva, anicteric sclera. HEENT: Normocephalic, moist mucous membranes. Lungs: No audible wheezing or coughing. Heart: Regular rate and rhythm. Abdomen: Soft, nontender, no obvious distention. Musculoskeletal: No edema, cyanosis, or clubbing. Neuro: Alert, answering all questions appropriately. Cranial nerves grossly intact. Skin: No rashes or petechiae noted. Psych: Normal affect.  LAB RESULTS:  Lab Results  Component Value Date   NA 137 08/21/2020   K 3.9 08/21/2020   CL 103 08/21/2020   CO2 25 08/21/2020   GLUCOSE 104 (H) 08/21/2020   BUN 20 08/21/2020   CREATININE 0.87 08/21/2020   CALCIUM 9.5 08/21/2020   PROT 7.3 08/21/2020   ALBUMIN 4.3 08/21/2020   AST 23 08/21/2020   ALT 13 08/21/2020   ALKPHOS 57 08/21/2020   BILITOT 0.8 08/21/2020   GFRNONAA >60 08/21/2020   GFRAA 65 04/20/2019    Lab Results  Component Value Date   WBC 6.6 03/24/2023   NEUTROABS 4.0 04/20/2019   HGB 9.3 (L) 03/24/2023   HCT 32.4 (L) 03/24/2023   MCV 73.5 (L) 03/24/2023   PLT 496 (H) 03/24/2023     STUDIES: No results found.  ASSESSMENT: Iron deficiency anemia.  PLAN:    Iron deficiency anemia: Patient's hemoglobin and iron stores are significantly reduced and she is symptomatic.  Other than a decreased B12 levels the remainder of her laboratory work is either negative or within normal limits.  Proceed with 200 mg IV Venofer today.  Return to clinic 4 times over the next 2 to 3 weeks for additional treatment.  Patient will then return to  clinic in 4 months with repeat laboratory work, further evaluation, and continuation of treatment if needed. B12 deficiency: Proceed with 1000 mcg IM B12 today.  Patient received 2 additional doses over the next several months.  Will repeat laboratory work along with iron stores as above.  Thrombocytosis: Likely reactive, monitor. Hypertension: Patient's blood pressure is significantly elevated today.  Continue evaluation and treatment per primary care.  I spent a total of 30 minutes reviewing chart data, face-to-face evaluation with the patient, counseling and coordination of care as detailed above.   Patient expressed understanding and was in agreement with this plan. She also understands that She can call clinic at any time with any questions, concerns, or complaints.     Jeralyn Ruths, MD   04/07/2023 3:47 PM

## 2023-04-15 ENCOUNTER — Inpatient Hospital Stay: Payer: PPO

## 2023-04-15 VITALS — BP 145/56 | HR 79 | Temp 98.3°F | Resp 16

## 2023-04-15 DIAGNOSIS — D509 Iron deficiency anemia, unspecified: Secondary | ICD-10-CM | POA: Diagnosis not present

## 2023-04-15 MED ORDER — IRON SUCROSE 20 MG/ML IV SOLN
200.0000 mg | Freq: Once | INTRAVENOUS | Status: AC
  Administered 2023-04-15: 200 mg via INTRAVENOUS
  Filled 2023-04-15: qty 10

## 2023-04-15 NOTE — Patient Instructions (Signed)
Iron Sucrose Injection What is this medication? IRON SUCROSE (EYE ern SOO krose) treats low levels of iron (iron deficiency anemia) in people with kidney disease. Iron is a mineral that plays an important role in making red blood cells, which carry oxygen from your lungs to the rest of your body. This medicine may be used for other purposes; ask your health care provider or pharmacist if you have questions. COMMON BRAND NAME(S): Venofer What should I tell my care team before I take this medication? They need to know if you have any of these conditions: Anemia not caused by low iron levels Heart disease High levels of iron in the blood Kidney disease Liver disease An unusual or allergic reaction to iron, other medications, foods, dyes, or preservatives Pregnant or trying to get pregnant Breastfeeding How should I use this medication? This medication is for infusion into a vein. It is given in a hospital or clinic setting. Talk to your care team about the use of this medication in children. While this medication may be prescribed for children as young as 2 years for selected conditions, precautions do apply. Overdosage: If you think you have taken too much of this medicine contact a poison control center or emergency room at once. NOTE: This medicine is only for you. Do not share this medicine with others. What if I miss a dose? Keep appointments for follow-up doses. It is important not to miss your dose. Call your care team if you are unable to keep an appointment. What may interact with this medication? Do not take this medication with any of the following: Deferoxamine Dimercaprol Other iron products This medication may also interact with the following: Chloramphenicol Deferasirox This list may not describe all possible interactions. Give your health care provider a list of all the medicines, herbs, non-prescription drugs, or dietary supplements you use. Also tell them if you smoke,  drink alcohol, or use illegal drugs. Some items may interact with your medicine. What should I watch for while using this medication? Visit your care team regularly. Tell your care team if your symptoms do not start to get better or if they get worse. You may need blood work done while you are taking this medication. You may need to follow a special diet. Talk to your care team. Foods that contain iron include: whole grains/cereals, dried fruits, beans, or peas, leafy green vegetables, and organ meats (liver, kidney). What side effects may I notice from receiving this medication? Side effects that you should report to your care team as soon as possible: Allergic reactions--skin rash, itching, hives, swelling of the face, lips, tongue, or throat Low blood pressure--dizziness, feeling faint or lightheaded, blurry vision Shortness of breath Side effects that usually do not require medical attention (report to your care team if they continue or are bothersome): Flushing Headache Joint pain Muscle pain Nausea Pain, redness, or irritation at injection site This list may not describe all possible side effects. Call your doctor for medical advice about side effects. You may report side effects to FDA at 1-800-FDA-1088. Where should I keep my medication? This medication is given in a hospital or clinic. It will not be stored at home. NOTE: This sheet is a summary. It may not cover all possible information. If you have questions about this medicine, talk to your doctor, pharmacist, or health care provider.  2024 Elsevier/Gold Standard (2022-10-17 00:00:00)

## 2023-04-17 ENCOUNTER — Inpatient Hospital Stay: Payer: PPO

## 2023-04-17 VITALS — BP 194/66 | HR 70 | Temp 97.0°F | Resp 18

## 2023-04-17 DIAGNOSIS — D509 Iron deficiency anemia, unspecified: Secondary | ICD-10-CM

## 2023-04-17 MED ORDER — IRON SUCROSE 20 MG/ML IV SOLN
200.0000 mg | Freq: Once | INTRAVENOUS | Status: AC
  Administered 2023-04-17: 200 mg via INTRAVENOUS
  Filled 2023-04-17: qty 10

## 2023-04-17 MED ORDER — SODIUM CHLORIDE 0.9% FLUSH
10.0000 mL | Freq: Once | INTRAVENOUS | Status: AC | PRN
  Administered 2023-04-17: 10 mL
  Filled 2023-04-17: qty 10

## 2023-04-20 ENCOUNTER — Inpatient Hospital Stay: Payer: PPO

## 2023-04-20 VITALS — BP 156/72 | HR 89 | Resp 16

## 2023-04-20 DIAGNOSIS — D509 Iron deficiency anemia, unspecified: Secondary | ICD-10-CM

## 2023-04-20 MED ORDER — IRON SUCROSE 20 MG/ML IV SOLN
200.0000 mg | Freq: Once | INTRAVENOUS | Status: AC
  Administered 2023-04-20: 200 mg via INTRAVENOUS
  Filled 2023-04-20: qty 10

## 2023-04-29 ENCOUNTER — Inpatient Hospital Stay: Payer: PPO | Attending: Oncology

## 2023-04-29 VITALS — BP 170/62 | HR 60 | Temp 96.4°F | Resp 17

## 2023-04-29 DIAGNOSIS — E538 Deficiency of other specified B group vitamins: Secondary | ICD-10-CM | POA: Insufficient documentation

## 2023-04-29 DIAGNOSIS — D509 Iron deficiency anemia, unspecified: Secondary | ICD-10-CM | POA: Diagnosis not present

## 2023-04-29 MED ORDER — IRON SUCROSE 20 MG/ML IV SOLN
200.0000 mg | Freq: Once | INTRAVENOUS | Status: AC
  Administered 2023-04-29: 200 mg via INTRAVENOUS
  Filled 2023-04-29: qty 10

## 2023-04-29 MED ORDER — SODIUM CHLORIDE 0.9% FLUSH
10.0000 mL | Freq: Once | INTRAVENOUS | Status: AC | PRN
  Administered 2023-04-29: 10 mL
  Filled 2023-04-29: qty 10

## 2023-05-07 ENCOUNTER — Inpatient Hospital Stay: Payer: PPO

## 2023-05-07 DIAGNOSIS — D509 Iron deficiency anemia, unspecified: Secondary | ICD-10-CM | POA: Diagnosis not present

## 2023-05-07 MED ORDER — CYANOCOBALAMIN 1000 MCG/ML IJ SOLN
1000.0000 ug | Freq: Once | INTRAMUSCULAR | Status: AC
Start: 2023-05-07 — End: 2023-05-07
  Administered 2023-05-07: 1000 ug via INTRAMUSCULAR
  Filled 2023-05-07: qty 1

## 2023-06-02 ENCOUNTER — Encounter: Payer: Self-pay | Admitting: Oncology

## 2023-06-08 ENCOUNTER — Encounter: Payer: Self-pay | Admitting: Oncology

## 2023-06-08 ENCOUNTER — Inpatient Hospital Stay: Payer: Medicare HMO | Attending: Oncology

## 2023-06-08 VITALS — BP 161/67 | HR 72 | Temp 96.8°F | Resp 18

## 2023-06-08 DIAGNOSIS — E538 Deficiency of other specified B group vitamins: Secondary | ICD-10-CM | POA: Diagnosis present

## 2023-06-08 DIAGNOSIS — D509 Iron deficiency anemia, unspecified: Secondary | ICD-10-CM

## 2023-06-08 MED ORDER — CYANOCOBALAMIN 1000 MCG/ML IJ SOLN
1000.0000 ug | Freq: Once | INTRAMUSCULAR | Status: AC
  Administered 2023-06-08: 1000 ug via INTRAMUSCULAR
  Filled 2023-06-08: qty 1

## 2023-07-03 ENCOUNTER — Other Ambulatory Visit: Payer: Self-pay | Admitting: Internal Medicine

## 2023-07-03 DIAGNOSIS — Z1231 Encounter for screening mammogram for malignant neoplasm of breast: Secondary | ICD-10-CM

## 2023-08-04 ENCOUNTER — Ambulatory Visit
Admission: RE | Admit: 2023-08-04 | Discharge: 2023-08-04 | Disposition: A | Discharge status: Home / Self Care | Payer: Medicare HMO | Source: Ambulatory Visit | Attending: Internal Medicine | Admitting: Internal Medicine

## 2023-08-04 DIAGNOSIS — Z1231 Encounter for screening mammogram for malignant neoplasm of breast: Secondary | ICD-10-CM | POA: Insufficient documentation

## 2023-08-10 ENCOUNTER — Inpatient Hospital Stay: Payer: PPO | Attending: Oncology

## 2023-08-10 DIAGNOSIS — Z803 Family history of malignant neoplasm of breast: Secondary | ICD-10-CM | POA: Insufficient documentation

## 2023-08-10 DIAGNOSIS — D509 Iron deficiency anemia, unspecified: Secondary | ICD-10-CM | POA: Diagnosis not present

## 2023-08-10 DIAGNOSIS — E538 Deficiency of other specified B group vitamins: Secondary | ICD-10-CM | POA: Diagnosis present

## 2023-08-10 DIAGNOSIS — I10 Essential (primary) hypertension: Secondary | ICD-10-CM | POA: Insufficient documentation

## 2023-08-10 LAB — IRON AND TIBC
Iron: 78 ug/dL (ref 28–170)
Saturation Ratios: 21 % (ref 10.4–31.8)
TIBC: 368 ug/dL (ref 250–450)
UIBC: 290 ug/dL

## 2023-08-10 LAB — FERRITIN: Ferritin: 76 ng/mL (ref 11–307)

## 2023-08-10 LAB — CBC (CANCER CENTER ONLY)
HCT: 43.9 % (ref 36.0–46.0)
Hemoglobin: 14 g/dL (ref 12.0–15.0)
MCH: 27.6 pg (ref 26.0–34.0)
MCHC: 31.9 g/dL (ref 30.0–36.0)
MCV: 86.6 fL (ref 80.0–100.0)
Platelet Count: 323 10*3/uL (ref 150–400)
RBC: 5.07 MIL/uL (ref 3.87–5.11)
RDW: 13.2 % (ref 11.5–15.5)
WBC Count: 6.1 10*3/uL (ref 4.0–10.5)
nRBC: 0 % (ref 0.0–0.2)

## 2023-08-10 LAB — VITAMIN B12: Vitamin B-12: 165 pg/mL — ABNORMAL LOW (ref 180–914)

## 2023-08-11 ENCOUNTER — Inpatient Hospital Stay (HOSPITAL_BASED_OUTPATIENT_CLINIC_OR_DEPARTMENT_OTHER): Payer: PPO | Admitting: Oncology

## 2023-08-11 ENCOUNTER — Inpatient Hospital Stay: Payer: PPO

## 2023-08-11 VITALS — BP 170/79 | HR 64 | Temp 97.2°F | Resp 16 | Ht 60.0 in | Wt 124.4 lb

## 2023-08-11 DIAGNOSIS — D509 Iron deficiency anemia, unspecified: Secondary | ICD-10-CM | POA: Diagnosis not present

## 2023-08-11 DIAGNOSIS — E538 Deficiency of other specified B group vitamins: Secondary | ICD-10-CM | POA: Diagnosis not present

## 2023-08-11 MED ORDER — CYANOCOBALAMIN 1000 MCG/ML IJ SOLN
1000.0000 ug | Freq: Once | INTRAMUSCULAR | Status: AC
  Administered 2023-08-11: 1000 ug via INTRAMUSCULAR
  Filled 2023-08-11: qty 1

## 2023-08-11 NOTE — Progress Notes (Signed)
  [facility]  Telephone:(336) [phone] Fax:(336) [phone]  ID: Brittany Alvarez OB: [birth year]  MR#: [id number]  AOZ#:[id number]  Patient Care Team: Gracelyn Nurse, MD as PCP - General (Internal Medicine)  CHIEF COMPLAINT: Iron deficiency anemia, B12 deficiency.  INTERVAL HISTORY: Patient returns to clinic today for further evaluation and consideration of additional Venofer and B12.  She continues to have chronic weakness and fatigue, but otherwise feels well.  She has no neurologic complaints.  She denies any recent fevers or illnesses.  She has a good appetite and denies weight loss.  She has no chest pain, shortness of breath, cough, or hemoptysis.  She denies any nausea, vomiting, constipation, or diarrhea.  She has no melena or hematochezia.  She has no urinary complaints.  Patient offers no further specific complaints today.  REVIEW OF SYSTEMS:   Review of Systems  Constitutional:  Positive for malaise/fatigue. Negative for fever and weight loss.  Respiratory: Negative.  Negative for cough, hemoptysis and shortness of breath.   Cardiovascular: Negative.  Negative for chest pain and leg swelling.  Gastrointestinal: Negative.  Negative for blood in stool, constipation, melena and nausea.  Genitourinary: Negative.  Negative for hematuria.  Musculoskeletal: Negative.  Negative for back pain.  Skin: Negative.  Negative for rash.  Neurological:  Positive for weakness. Negative for dizziness, focal weakness and headaches.  Psychiatric/Behavioral: Negative.  The patient is not nervous/anxious.     As per HPI. Otherwise, a complete review of systems is negative.  PAST MEDICAL HISTORY: Past Medical History:  Diagnosis Date   Anemia    Anxiety    Arthritis    right hand   Blood transfusion    approx. 40 years ago   Deaf, right    Gallstones    hx of   GERD (gastroesophageal reflux disease)    Hyperlipidemia    Hypertension    sees Dr. Judithann Graves in [place], [place]  [phone]    PAST SURGICAL HISTORY: Past Surgical History:  Procedure Laterality Date   ANTERIOR LAT LUMBAR FUSION  06/24/2011   Procedure: ANTERIOR LATERAL LUMBAR FUSION 1 LEVEL;  Surgeon: Reinaldo Meeker, MD;  Location: [facility];  Service: Neurosurgery;  Laterality: Right;  Right Lumbar Three-Four Anterior Lateral Lumbar Interbody Fusion with Right Lumbar Three-Four percutaneous pedicle screws (Lateral Postion for both procedures)   APPENDECTOMY     BACK SURGERY     x 2 (rods and screws in lumbar region)   BREAST CYST ASPIRATION Left 1990   NEG   BREAST EXCISIONAL BIOPSY Left age 84's   NEG   CATARACT EXTRACTION W/PHACO Left 08/01/2019   Procedure: CATARACT EXTRACTION PHACO AND INTRAOCULAR LENS PLACEMENT (IOC) LEFT;  Surgeon: Nevada Crane, MD;  Location: [facility];  Service: Ophthalmology;  Laterality: Left;  CDE 16.0 U/S 1:29.4   CATARACT EXTRACTION W/PHACO Right 08/22/2019   Procedure: CATARACT EXTRACTION PHACO AND INTRAOCULAR LENS PLACEMENT (IOC) RIGHT;  Surgeon: Nevada Crane, MD;  Location: [facility];  Service: Ophthalmology;  Laterality: Right;  1.98 0:25.2   CHOLECYSTECTOMY     COLONOSCOPY WITH PROPOFOL N/A 01/15/2016   Procedure: COLONOSCOPY WITH PROPOFOL;  Surgeon: Christena Deem, MD;  Location: [facility];  Service: Endoscopy;  Laterality: N/A;   COLONOSCOPY WITH PROPOFOL N/A 04/04/2019   Procedure: COLONOSCOPY WITH PROPOFOL;  Surgeon: Toledo, Boykin Nearing, MD;  Location: [facility];  Service: Gastroenterology;  Laterality: N/A;   OVARY SURGERY     removal of left and left tube  TONSILLECTOMY     vestibular tumor resection Right 2016   schwannoma    FAMILY HISTORY: Family History  Problem Relation Age of Onset   COPD Father    Breast cancer Mother 56   Heart failure Mother     ADVANCED DIRECTIVES (Y/N):  N  HEALTH MAINTENANCE: Social History   Tobacco Use   Smoking status: Never   Smokeless tobacco: Never   Tobacco  comments:    smoking cessation materials not required  Vaping Use   Vaping status: Never Used  Substance Use Topics   Alcohol use: Yes    Alcohol/week: 0.0 standard drinks of alcohol    Comment: occ - 1x/month   Drug use: No     Colonoscopy:  PAP:  Bone density:  Lipid panel:  Allergies  Allergen Reactions   Povidone Iodine Rash   Latex Hives    Patient states painful and rash, (only reaction was after back surgery when betadine was used. Unsure if Latex or betadine issue)   Metolazone Other (See Comments)    Hypercalcemia   Thiazide-Type Diuretics     Hypercalcemia   Diflucan [Fluconazole] Rash    Possible rash that occurred one week after completing one week of diflucan    Current Outpatient Medications  Medication Sig Dispense Refill   alendronate (FOSAMAX) 70 MG tablet TAKE ONE TABLET BY MOUTH EACH WEEK, ON AN EMPTY STOMACH BEFORE BREAKFAST WITH 8oz OF WATER AND REMAIN UPRIGHT FOR :30 4 tablet 12   amLODipine (NORVASC) 2.5 MG tablet Take 1 tablet by mouth daily.     aspirin 81 MG tablet Take 81 mg by mouth daily.     busPIRone (BUSPAR) 10 MG tablet TAKE 1 TABLET BY MOUTH TWICE DAILY 60 tablet 5   Cholecalciferol (VITAMIN D3 SUPER STRENGTH) 50 MCG (2000 UT) CAPS Take 1 capsule by mouth daily.     furosemide (LASIX) 20 MG tablet TAKE 1 TABLET BY MOUTH ONCE DAILY 30 tablet 12   lisinopril (ZESTRIL) 20 MG tablet TAKE 1 TABLET BY MOUTH ONCE DAILY (Patient taking differently: 40 mg.) 30 tablet 5   loratadine (CLARITIN) 10 MG tablet Take 10 mg by mouth daily. otc     lovastatin (MEVACOR) 20 MG tablet TAKE 2 TABLETS BY MOUTH AT BEDTIME AS DIRECTED 60 tablet 12   Nutritional Supplements (NUTRITIONAL SUPPLEMENT PO) Take 1 capsule by mouth daily. Reports taking Cognium 100 mg once daily     Probiotic Product (PROBIOTIC ADVANCED PO) Take by mouth.     tiZANidine (ZANAFLEX) 4 MG tablet TAKE 1 TABLET BY MOUTH EVERY 8 HOURS AS NEEDED FOR MUSCLE SPASMS 90 tablet 12   vitamin B-12  (CYANOCOBALAMIN) 100 MCG tablet Take 100 mcg by mouth daily.     No current facility-administered medications for this visit.   Facility-Administered Medications Ordered in Other Visits  Medication Dose Route Frequency Provider Last Rate Last Admin   cyanocobalamin (VITAMIN B12) injection 1,000 mcg  1,000 mcg Intramuscular Once Jeralyn Ruths, MD        OBJECTIVE: Vitals:   08/11/23 1401  BP: (!) 211/68  Pulse: 64  Resp: 16  Temp: (!) 97.2 F (36.2 C)  SpO2: 99%     Body mass index is 24.3 kg/m.    ECOG FS:1 - Symptomatic but completely ambulatory  General: Well-developed, well-nourished, no acute distress. Eyes: Pink conjunctiva, anicteric sclera. HEENT: Normocephalic, moist mucous membranes. Lungs: No audible wheezing or coughing. Heart: Regular rate and rhythm. Abdomen: Soft, nontender, no obvious distention.  Musculoskeletal: No edema, cyanosis, or clubbing. Neuro: Alert, answering all questions appropriately. Cranial nerves grossly intact. Skin: No rashes or petechiae noted. Psych: Normal affect.   LAB RESULTS:  Lab Results  Component Value Date   NA 137 08/21/2020   K 3.9 08/21/2020   CL 103 08/21/2020   CO2 25 08/21/2020   GLUCOSE 104 (H) 08/21/2020   BUN 20 08/21/2020   CREATININE 0.87 08/21/2020   CALCIUM 9.5 08/21/2020   PROT 7.3 08/21/2020   ALBUMIN 4.3 08/21/2020   AST 23 08/21/2020   ALT 13 08/21/2020   ALKPHOS 57 08/21/2020   BILITOT 0.8 08/21/2020   GFRNONAA >60 08/21/2020   GFRAA 65 04/20/2019    Lab Results  Component Value Date   WBC 6.1 08/10/2023   NEUTROABS 4.0 04/20/2019   HGB 14.0 08/10/2023   HCT 43.9 08/10/2023   MCV 86.6 08/10/2023   PLT 323 08/10/2023   Lab Results  Component Value Date   IRON 78 08/10/2023   TIBC 368 08/10/2023   IRONPCTSAT 21 08/10/2023   Lab Results  Component Value Date   FERRITIN 76 08/10/2023     STUDIES: MM 3D SCREENING MAMMOGRAM BILATERAL BREAST Result Date: 08/07/2023 CLINICAL DATA:   Screening. EXAM: DIGITAL SCREENING BILATERAL MAMMOGRAM WITH TOMOSYNTHESIS AND CAD TECHNIQUE: Bilateral screening digital craniocaudal and mediolateral oblique mammograms were obtained. Bilateral screening digital breast tomosynthesis was performed. The images were evaluated with computer-aided detection. COMPARISON:  Previous exam(s). ACR Breast Density Category a: The breasts are almost entirely fatty. FINDINGS: There are no findings suspicious for malignancy. IMPRESSION: No mammographic evidence of malignancy. A result letter of this screening mammogram will be mailed directly to the patient. RECOMMENDATION: Screening mammogram in one year. (Code:SM-B-01Y) BI-RADS CATEGORY  1: Negative. Electronically Signed   By: Hulan Saas M.D.   On: 08/07/2023 09:56    ASSESSMENT: Iron deficiency anemia.  PLAN:    Iron deficiency anemia: Patient's hemoglobin and iron stores are now within normal limits.  She does not require additional IV Venofer today.  Patient last received treatment on April 29, 2023.  No further intervention is needed.  Return to clinic in 4 months with repeat laboratory work, further evaluation, and consideration of additional IV iron if needed.   B12 deficiency: Chronic and unchanged.  Proceed with 1000 mcg IM B12 today.  Return to clinic monthly for injections and then in 4 months as above. Thrombocytosis: Resolved. Hypertension: Patient's blood pressure remains significantly elevated.  I have instructed patient to follow-up closely with her primary care physician for further evaluation and treatment.  Patient expressed understanding and was in agreement with this plan. She also understands that She can call clinic at any time with any questions, concerns, or complaints.     Jeralyn Ruths, MD   08/11/2023 2:36 PM

## 2023-09-11 ENCOUNTER — Inpatient Hospital Stay: Attending: Oncology

## 2023-09-11 DIAGNOSIS — D509 Iron deficiency anemia, unspecified: Secondary | ICD-10-CM

## 2023-09-11 DIAGNOSIS — E538 Deficiency of other specified B group vitamins: Secondary | ICD-10-CM | POA: Diagnosis present

## 2023-09-11 MED ORDER — CYANOCOBALAMIN 1000 MCG/ML IJ SOLN
1000.0000 ug | Freq: Once | INTRAMUSCULAR | Status: AC
  Administered 2023-09-11: 1000 ug via INTRAMUSCULAR
  Filled 2023-09-11: qty 1

## 2023-10-12 ENCOUNTER — Inpatient Hospital Stay: Attending: Oncology

## 2023-10-12 DIAGNOSIS — E538 Deficiency of other specified B group vitamins: Secondary | ICD-10-CM | POA: Insufficient documentation

## 2023-10-12 DIAGNOSIS — D509 Iron deficiency anemia, unspecified: Secondary | ICD-10-CM

## 2023-10-12 MED ORDER — CYANOCOBALAMIN 1000 MCG/ML IJ SOLN
1000.0000 ug | Freq: Once | INTRAMUSCULAR | Status: AC
  Administered 2023-10-12: 1000 ug via INTRAMUSCULAR
  Filled 2023-10-12: qty 1

## 2023-11-12 ENCOUNTER — Inpatient Hospital Stay: Attending: Oncology

## 2023-11-12 DIAGNOSIS — E538 Deficiency of other specified B group vitamins: Secondary | ICD-10-CM | POA: Insufficient documentation

## 2023-11-12 DIAGNOSIS — D509 Iron deficiency anemia, unspecified: Secondary | ICD-10-CM

## 2023-11-12 MED ORDER — CYANOCOBALAMIN 1000 MCG/ML IJ SOLN
1000.0000 ug | Freq: Once | INTRAMUSCULAR | Status: AC
  Administered 2023-11-12: 1000 ug via INTRAMUSCULAR
  Filled 2023-11-12: qty 1

## 2023-12-15 ENCOUNTER — Other Ambulatory Visit: Payer: Self-pay

## 2023-12-15 ENCOUNTER — Inpatient Hospital Stay: Attending: Oncology

## 2023-12-15 DIAGNOSIS — D509 Iron deficiency anemia, unspecified: Secondary | ICD-10-CM | POA: Insufficient documentation

## 2023-12-15 DIAGNOSIS — E538 Deficiency of other specified B group vitamins: Secondary | ICD-10-CM | POA: Diagnosis not present

## 2023-12-15 LAB — CBC WITH DIFFERENTIAL (CANCER CENTER ONLY)
Abs Immature Granulocytes: 0.02 K/uL (ref 0.00–0.07)
Basophils Absolute: 0.1 K/uL (ref 0.0–0.1)
Basophils Relative: 1 %
Eosinophils Absolute: 0.2 K/uL (ref 0.0–0.5)
Eosinophils Relative: 2 %
HCT: 43.1 % (ref 36.0–46.0)
Hemoglobin: 13.5 g/dL (ref 12.0–15.0)
Immature Granulocytes: 0 %
Lymphocytes Relative: 36 %
Lymphs Abs: 2.4 K/uL (ref 0.7–4.0)
MCH: 27.4 pg (ref 26.0–34.0)
MCHC: 31.3 g/dL (ref 30.0–36.0)
MCV: 87.6 fL (ref 80.0–100.0)
Monocytes Absolute: 0.5 K/uL (ref 0.1–1.0)
Monocytes Relative: 8 %
Neutro Abs: 3.5 K/uL (ref 1.7–7.7)
Neutrophils Relative %: 53 %
Platelet Count: 341 K/uL (ref 150–400)
RBC: 4.92 MIL/uL (ref 3.87–5.11)
RDW: 12.8 % (ref 11.5–15.5)
WBC Count: 6.7 K/uL (ref 4.0–10.5)
nRBC: 0 % (ref 0.0–0.2)

## 2023-12-15 LAB — IRON AND TIBC
Iron: 69 ug/dL (ref 28–170)
Saturation Ratios: 20 % (ref 10.4–31.8)
TIBC: 349 ug/dL (ref 250–450)
UIBC: 280 ug/dL

## 2023-12-15 LAB — FERRITIN: Ferritin: 72 ng/mL (ref 11–307)

## 2023-12-15 LAB — VITAMIN B12: Vitamin B-12: 282 pg/mL (ref 180–914)

## 2023-12-16 ENCOUNTER — Ambulatory Visit: Admitting: Oncology

## 2023-12-16 ENCOUNTER — Ambulatory Visit

## 2023-12-30 ENCOUNTER — Ambulatory Visit

## 2023-12-30 ENCOUNTER — Ambulatory Visit: Admitting: Hospice and Palliative Medicine

## 2024-04-04 ENCOUNTER — Other Ambulatory Visit: Payer: Self-pay | Admitting: Nephrology

## 2024-04-04 DIAGNOSIS — N1831 Chronic kidney disease, stage 3a: Secondary | ICD-10-CM

## 2024-04-04 NOTE — Progress Notes (Signed)
 [facility] New Consult Visit  Patient Name: Brittany Alvarez, female   Patient DOB: 04-21-1946 Date of Service: 04/04/2024  Patient MRN: [id number] Provider Creating Note: Bonnell Sherry, MD  [phone] Primary Care Physician: No primary care provider on file.  [address] [place] [address] Additional Physicians/ Providers:    History of Present Illness Brittany Alvarez is a 78 y.o. female with past medical history of hypertension, anxiety, GERD, hyperlipidemia, tubular adenoma of the colon who was referred for the evaluation of chronic kidney disease stage IIIa.  I appreciate labs and records forwarded to our office by Dr. Rudolpho.  On 09/17/2023 creatinine was 1.2 with an eGFR 47.  More recently on 03/07/2024 creatinine was 1.1 with an eGFR 52.  Patient has risk factors for chronic kidney disease including age and hypertension.  She also has history of intermittent NSAID use.  She has underlying hypertension with blood pressure 140/82.  She currently denies nausea, vomiting, or dysgeusia.  She also denies gross hematuria or foamy urine.   The following portions of the patient's chart were reviewed in this encounter and updated as appropriate:  Tobacco  Allergies  Meds  Problems  Med Hx  Surg Hx  Fam Hx        Medications   Current Outpatient Medications:  .  alendronate  (FOSAMAX ) 70 MG tablet, TAKE ONE TABLET BY MOUTH EACH WEEK, ON AN EMPTY STOMACH BEFORE BREAKFAST WITH 8oz OF WATER AND REMAIN UPRIGHT FOR :30, Disp: , Rfl:  .  amLODIPine (NORVASC) 5 MG tablet, Take 5 mg by mouth in the morning., Disp: , Rfl:  .  busPIRone  (BUSPAR ) 15 MG tablet, Take 15 mg by mouth in the morning and 15 mg in the evening., Disp: , Rfl:  .  citalopram (CeleXA) 10 MG tablet, Take 10 mg by mouth in the morning., Disp: , Rfl:  .  esomeprazole (NexIUM) 40 MG DR capsule, Take 40 mg by mouth in the morning., Disp: , Rfl:  .  furosemide  (LASIX ) 20 MG tablet, Take 20 mg by mouth  in the morning., Disp: , Rfl:  .  lisinopril  40 MG tablet, TAKE 1 TABLET BY MOUTH ONCE DAILY *DOSE INCREASE*, Disp: , Rfl:  .  lovastatin  (MEVACOR ) 40 MG tablet, Take 40 mg by mouth in the morning., Disp: , Rfl:  .  nebivolol (BYSTOLIC) 2.5 MG tablet, Take 2.5 mg by mouth in the morning., Disp: , Rfl:  .  tiZANidine  (ZANAFLEX ) 4 MG tablet, TAKE 1 TABLET BY MOUTH EVERY 8 HOURS AS NEEDED MUSCLE SPASMS, Disp: , Rfl:  .  acetaminophen  (TYLENOL ) 500 MG tablet, Take 1,000 mg by mouth every 6 hours as needed, Disp: , Rfl:  .  aspirin (ST JOSEPH) 81 MG EC tablet, Take by mouth, Disp: , Rfl:  .  Cholecalciferol 50 MCG (2000 UT) capsule, Take 2,000 Units by mouth in the morning., Disp: , Rfl:  .  Lactobacillus capsule, Take by mouth, Disp: , Rfl:    Allergies Latex, Povidone iodine, Povidone-iodine, Benzthiazide, Metolazone, Thiazide-type diuretics, and Fluconazole   Problem List There is no problem list on file for this patient.    Review of Systems  Constitutional:  Positive for malaise/fatigue. Negative for chills and fever.  HENT:  Negative for congestion, ear pain, hearing loss and sore throat.   Eyes:  Negative for pain and discharge.  Respiratory:  Negative for cough, shortness of breath and wheezing.   Cardiovascular:  Positive for leg swelling. Negative for  chest pain and palpitations.  Gastrointestinal:  Negative for abdominal pain, blood in stool, constipation, diarrhea, nausea and vomiting.  Genitourinary:  Negative for dysuria, frequency, hematuria and urgency.  Musculoskeletal:  Negative for back pain, myalgias and neck pain.  Skin:  Negative for rash.  Neurological:  Negative for dizziness, tremors and headaches.  Endo/Heme/Allergies:  Negative for polydipsia. Does not bruise/bleed easily.     History Past Medical History:  Diagnosis Date  . Esophageal reflux   . Essential hypertension   . Osteoarthrosis, unspecified whether generalized or localized, involving unspecified  site     History reviewed. No pertinent surgical history. Family History  Problem Relation Age of Onset  . Heart disease Mother   . Cancer Mother   . Heart disease Sister   . Hypertension Sister   . Heart disease Brother   . Hypertension Brother    Social History   Tobacco Use  . Smoking status: Never  . Smokeless tobacco: Never  Substance Use Topics  . Alcohol  use: Not Currently        Physical Exam  Vitals BP 140/82 (BP Location: Right upper arm, Patient Position: Sitting)   Pulse 77   Temp 97.9 F   Ht 5' (1.524 m)   Wt 128 lb (58.1 kg)   SpO2 95%   BMI 25.00 kg/m   PHYSICAL EXAM: General appearance: well developed, well nourished, NAD Eyes: anicteric sclerae, moist conjunctivae; no lid-lag  HENT: Atraumatic; hearing intact Neck: Trachea midline; supple Lungs: CTAB, with normal respiratory effort  CV: RRR, no MRGs  Abdomen: Soft, non-tender; bowel sounds present Extremities: Trace lower extremity edema Skin: Warm and dry, normal skin turgor, no rashes noted. Psych: Appropriate affect, alert and oriented to person, place and time  Neuro: Normal gait  Laboratory Studies  Chemistry  Lab Units 03/07/24 0744 09/17/23 0744 03/11/23 0846 08/20/22 0801  SODIUM mmol/L 139 140 143 141  POTASSIUM mmol/L 4.2 4.1 4.1 4.2  CHLORIDE mmol/L 104 105 107 107  CO2 mmol/L 26.9 26.1 27.6 28.1  ANION GAP  12.3  --  12.5  --   CALCIUM mg/dL 9.6 9.6 9.5 9.6  ALK PHOS U/L  --  70  --  65  GLUCOSE mg/dL 862* 96 879* 89  ALBUMIN g/dL  --  4.5  --  4.3  BUN mg/dL 25 25 25  31*  CREATININE mg/dL 1.1 1.2* 1.0 1.1        No lab exists for component: IRON  SATURATION, TRANSSATPER          No lab exists for component: GLUCOSEUR, BILIRUBINUR, SPECGRAV, RBCUR, LEUKOCYTESUR, NITRITE  Imaging and Other Studies    Problem List Items Addressed This Visit   None Visit Diagnoses       Stage 3a chronic kidney disease (HCC)    -  Primary     Benign  essential hypertension          Orders Placed This Encounter  . Ultrasound renal complete  . Renal Function Panel  . CBC and Differential  . Protein electrophoresis, serum  . Protein Electrophoresis, Urine Random  . PTH, Intact  . ANA Panel       Impression/Recommendations   Brittany Alvarez is a 78 y.o. female with past medical history of hypertension, anxiety, GERD, hyperlipidemia, tubular adenoma of the colon who was referred for the evaluation of chronic kidney disease stage IIIa.  1.  Chronic kidney disease stage IIIa.  The patient was referred to our practice for evaluation management  of chronic kidney disease stage IIIa.  On 03/07/2024 creatinine was 1.1 with an eGFR of 52.  Therefore it appears that she does have underlying chronic kidney disease stage IIIa.  We will proceed with additional workup including renal ultrasound, SPEP, UPEP, and ANA.  Will maintain the patient on lisinopril  for renal protection and proteinuria suppression.  She does intermittently take NSAIDs and I have advised her to avoid this.  2.  Hypertension.  Blood pressure currently 140/82.  Maintain the patient on current doses of amlodipine, lisinopril , and Bystolic.  3.  I would like to thank Dr. Rudolpho for the kind referral.  Return in about 4 weeks (around 05/02/2024).   Munsoor Lateef, MD

## 2024-04-06 ENCOUNTER — Ambulatory Visit
Admission: RE | Admit: 2024-04-06 | Discharge: 2024-04-06 | Disposition: A | Discharge status: Home / Self Care | Source: Ambulatory Visit | Attending: Nephrology | Admitting: Nephrology

## 2024-04-06 ENCOUNTER — Ambulatory Visit

## 2024-04-06 DIAGNOSIS — N1831 Chronic kidney disease, stage 3a: Secondary | ICD-10-CM | POA: Insufficient documentation

## 2024-04-19 ENCOUNTER — Other Ambulatory Visit: Payer: Self-pay | Admitting: *Deleted

## 2024-04-19 DIAGNOSIS — D509 Iron deficiency anemia, unspecified: Secondary | ICD-10-CM

## 2024-04-25 ENCOUNTER — Inpatient Hospital Stay: Attending: Oncology

## 2024-04-26 ENCOUNTER — Inpatient Hospital Stay: Admitting: Oncology

## 2024-04-26 ENCOUNTER — Ambulatory Visit: Admitting: Hospice and Palliative Medicine

## 2024-04-26 ENCOUNTER — Ambulatory Visit

## 2024-04-26 ENCOUNTER — Inpatient Hospital Stay

## 2024-05-30 ENCOUNTER — Inpatient Hospital Stay: Attending: Oncology

## 2024-05-30 DIAGNOSIS — D509 Iron deficiency anemia, unspecified: Secondary | ICD-10-CM

## 2024-05-30 LAB — CBC WITH DIFFERENTIAL (CANCER CENTER ONLY)
Abs Immature Granulocytes: 0.03 K/uL (ref 0.00–0.07)
Basophils Absolute: 0.1 K/uL (ref 0.0–0.1)
Basophils Relative: 1 %
Eosinophils Absolute: 0.2 K/uL (ref 0.0–0.5)
Eosinophils Relative: 4 %
HCT: 42.4 % (ref 36.0–46.0)
Hemoglobin: 13.5 g/dL (ref 12.0–15.0)
Immature Granulocytes: 1 %
Lymphocytes Relative: 32 %
Lymphs Abs: 2.1 K/uL (ref 0.7–4.0)
MCH: 27.7 pg (ref 26.0–34.0)
MCHC: 31.8 g/dL (ref 30.0–36.0)
MCV: 87.1 fL (ref 80.0–100.0)
Monocytes Absolute: 0.5 K/uL (ref 0.1–1.0)
Monocytes Relative: 7 %
Neutro Abs: 3.7 K/uL (ref 1.7–7.7)
Neutrophils Relative %: 55 %
Platelet Count: 354 K/uL (ref 150–400)
RBC: 4.87 MIL/uL (ref 3.87–5.11)
RDW: 13 % (ref 11.5–15.5)
WBC Count: 6.6 K/uL (ref 4.0–10.5)
nRBC: 0 % (ref 0.0–0.2)

## 2024-05-30 LAB — FERRITIN: Ferritin: 143 ng/mL (ref 11–307)

## 2024-05-30 LAB — VITAMIN B12: Vitamin B-12: 395 pg/mL (ref 180–914)

## 2024-05-30 LAB — IRON AND TIBC
Iron: 98 ug/dL (ref 28–170)
Saturation Ratios: 27 % (ref 10.4–31.8)
TIBC: 365 ug/dL (ref 250–450)
UIBC: 267 ug/dL

## 2024-05-31 ENCOUNTER — Inpatient Hospital Stay

## 2024-05-31 ENCOUNTER — Inpatient Hospital Stay: Admitting: Oncology

## 2024-05-31 ENCOUNTER — Encounter: Payer: Self-pay | Admitting: Oncology

## 2024-05-31 VITALS — BP 128/76 | HR 73 | Temp 97.4°F | Resp 18 | Ht 61.0 in | Wt 133.0 lb

## 2024-05-31 DIAGNOSIS — D509 Iron deficiency anemia, unspecified: Secondary | ICD-10-CM | POA: Diagnosis not present

## 2024-05-31 NOTE — Progress Notes (Signed)
 Patient is doing ok, having some kidney trouble, she feels like she might have a UTI, which she does have a Kidney Specialist appointment coming up really soon.

## 2024-05-31 NOTE — Progress Notes (Signed)
 " [facility]  [phone] [phone]  ID: NALINI ALCARAZ OB: Oct 13, 1945  MR#: [id number]  RDW#:[id number]  Patient Care Team: Rudolpho Norleen BIRCH, MD as PCP - General (Internal Medicine) Jacobo Evalene PARAS, MD as Consulting Physician (Oncology)  CHIEF COMPLAINT: Iron  deficiency anemia, B12 deficiency.  INTERVAL HISTORY: Patient returns to clinic today for repeat laboratory work, further evaluation, and consideration of additional IV Venofer .  She has chronic fatigue, but otherwise feels well.  She has no neurologic complaints.  She denies any recent fevers or illnesses.  She has a good appetite and denies weight loss.  She has no chest pain, shortness of breath, cough, or hemoptysis.  She denies any nausea, vomiting, constipation, or diarrhea.  She has no melena or hematochezia.  She has no urinary complaints.  Patient offers no further specific complaints today.  REVIEW OF SYSTEMS:   Review of Systems  Constitutional:  Positive for malaise/fatigue. Negative for fever and weight loss.  Respiratory: Negative.  Negative for cough, hemoptysis and shortness of breath.   Cardiovascular: Negative.  Negative for chest pain and leg swelling.  Gastrointestinal: Negative.  Negative for blood in stool, constipation, melena and nausea.  Genitourinary: Negative.  Negative for hematuria.  Musculoskeletal: Negative.  Negative for back pain.  Skin: Negative.  Negative for rash.  Neurological: Negative.  Negative for dizziness, focal weakness, weakness and headaches.  Psychiatric/Behavioral: Negative.  The patient is not nervous/anxious.     As per HPI. Otherwise, a complete review of systems is negative.  PAST MEDICAL HISTORY: Past Medical History:  Diagnosis Date   Anemia    Anxiety    Arthritis    right hand   Blood transfusion    approx. 40 years ago   Deaf, right    Gallstones    hx of   GERD (gastroesophageal reflux disease)    Hyperlipidemia     Hypertension    sees Dr. Justus in [place], [place] [phone]    PAST SURGICAL HISTORY: Past Surgical History:  Procedure Laterality Date   ANTERIOR LAT LUMBAR FUSION  06/24/2011   Procedure: ANTERIOR LATERAL LUMBAR FUSION 1 LEVEL;  Surgeon: Darina MALVA Boehringer, MD;  Location: [facility];  Service: Neurosurgery;  Laterality: Right;  Right Lumbar Three-Four Anterior Lateral Lumbar Interbody Fusion with Right Lumbar Three-Four percutaneous pedicle screws (Lateral Postion for both procedures)   APPENDECTOMY     BACK SURGERY     x 2 (rods and screws in lumbar region)   BREAST CYST ASPIRATION Left 1990   NEG   BREAST EXCISIONAL BIOPSY Left age 18's   NEG   CATARACT EXTRACTION W/PHACO Left 08/01/2019   Procedure: CATARACT EXTRACTION PHACO AND INTRAOCULAR LENS PLACEMENT (IOC) LEFT;  Surgeon: Myrna Adine Anes, MD;  Location: [facility];  Service: Ophthalmology;  Laterality: Left;  CDE 16.0 U/S 1:29.4   CATARACT EXTRACTION W/PHACO Right 08/22/2019   Procedure: CATARACT EXTRACTION PHACO AND INTRAOCULAR LENS PLACEMENT (IOC) RIGHT;  Surgeon: Myrna Adine Anes, MD;  Location: [facility];  Service: Ophthalmology;  Laterality: Right;  1.98 0:25.2   CHOLECYSTECTOMY     COLONOSCOPY WITH PROPOFOL  N/A 01/15/2016   Procedure: COLONOSCOPY WITH PROPOFOL ;  Surgeon: Gladis RAYMOND Mariner, MD;  Location: [facility];  Service: Endoscopy;  Laterality: N/A;   COLONOSCOPY WITH PROPOFOL  N/A 04/04/2019   Procedure: COLONOSCOPY WITH PROPOFOL ;  Surgeon: Toledo, Ladell POUR, MD;  Location: [facility] ENDOSCOPY;  Service: Gastroenterology;  Laterality: N/A;   OVARY SURGERY     removal  of left and left tube   TONSILLECTOMY     vestibular tumor resection Right 2016   schwannoma    FAMILY HISTORY: Family History  Problem Relation Age of Onset   COPD Father    Breast cancer Mother 61   Heart failure Mother     ADVANCED DIRECTIVES (Y/N):  N  HEALTH MAINTENANCE: Social History   Tobacco Use   Smoking  status: Never   Smokeless tobacco: Never   Tobacco comments:    smoking cessation materials not required  Vaping Use   Vaping status: Never Used  Substance Use Topics   Alcohol  use: Yes    Alcohol /week: 0.0 standard drinks of alcohol     Comment: occ - 1x/month   Drug use: No     Colonoscopy:  PAP:  Bone density:  Lipid panel:  Allergies  Allergen Reactions   Povidone Iodine Rash   Latex Hives    Patient states painful and rash, (only reaction was after back surgery when betadine was used. Unsure if Latex or betadine issue)   Metolazone Other (See Comments)    Hypercalcemia   Thiazide-Type Diuretics     Hypercalcemia   Diflucan  [Fluconazole ] Rash    Possible rash that occurred one week after completing one week of diflucan     Current Outpatient Medications  Medication Sig Dispense Refill   alendronate  (FOSAMAX ) 70 MG tablet TAKE ONE TABLET BY MOUTH EACH WEEK, ON AN EMPTY STOMACH BEFORE BREAKFAST WITH 8oz OF WATER AND REMAIN UPRIGHT FOR :30 4 tablet 12   amLODipine (NORVASC) 2.5 MG tablet Take 1 tablet by mouth daily.     aspirin 81 MG tablet Take 81 mg by mouth daily.     busPIRone  (BUSPAR ) 10 MG tablet TAKE 1 TABLET BY MOUTH TWICE DAILY 60 tablet 5   Cholecalciferol (VITAMIN D3 SUPER STRENGTH) 50 MCG (2000 UT) CAPS Take 1 capsule by mouth daily.     citalopram (CELEXA) 10 MG tablet Take 10 mg by mouth daily.     esomeprazole (NEXIUM) 40 MG capsule Take 40 mg by mouth.     furosemide  (LASIX ) 20 MG tablet TAKE 1 TABLET BY MOUTH ONCE DAILY 30 tablet 12   lisinopril  (ZESTRIL ) 20 MG tablet TAKE 1 TABLET BY MOUTH ONCE DAILY (Patient taking differently: 40 mg.) 30 tablet 5   loratadine (CLARITIN) 10 MG tablet Take 10 mg by mouth daily. otc     lovastatin  (MEVACOR ) 20 MG tablet TAKE 2 TABLETS BY MOUTH AT BEDTIME AS DIRECTED 60 tablet 12   nebivolol (BYSTOLIC) 2.5 MG tablet Take 2.5 mg by mouth daily.     Nutritional Supplements (NUTRITIONAL SUPPLEMENT PO) Take 1 capsule by mouth  daily. Reports taking Cognium 100 mg once daily     Probiotic Product (PROBIOTIC ADVANCED PO) Take by mouth.     tiZANidine  (ZANAFLEX ) 4 MG tablet TAKE 1 TABLET BY MOUTH EVERY 8 HOURS AS NEEDED FOR MUSCLE SPASMS 90 tablet 12   vitamin B-12 (CYANOCOBALAMIN ) 100 MCG tablet Take 100 mcg by mouth daily.     No current facility-administered medications for this visit.    OBJECTIVE: Vitals:   05/31/24 0947  BP: 128/76  Pulse: 73  Resp: 18  Temp: (!) 97.4 F (36.3 C)  SpO2: 99%     Body mass index is 25.13 kg/m.    ECOG FS:0 - Asymptomatic  General: Well-developed, well-nourished, no acute distress. Eyes: Pink conjunctiva, anicteric sclera. HEENT: Normocephalic, moist mucous membranes. Lungs: No audible wheezing or coughing. Heart: Regular rate  and rhythm. Abdomen: Soft, nontender, no obvious distention. Musculoskeletal: No edema, cyanosis, or clubbing. Neuro: Alert, answering all questions appropriately. Cranial nerves grossly intact. Skin: No rashes or petechiae noted. Psych: Normal affect.  LAB RESULTS:  Lab Results  Component Value Date   NA 137 08/21/2020   K 3.9 08/21/2020   CL 103 08/21/2020   CO2 25 08/21/2020   GLUCOSE 104 (H) 08/21/2020   BUN 20 08/21/2020   CREATININE 0.87 08/21/2020   CALCIUM 9.5 08/21/2020   PROT 7.3 08/21/2020   ALBUMIN 4.3 08/21/2020   AST 23 08/21/2020   ALT 13 08/21/2020   ALKPHOS 57 08/21/2020   BILITOT 0.8 08/21/2020   GFRNONAA >60 08/21/2020   GFRAA 65 04/20/2019    Lab Results  Component Value Date   WBC 6.6 05/30/2024   NEUTROABS 3.7 05/30/2024   HGB 13.5 05/30/2024   HCT 42.4 05/30/2024   MCV 87.1 05/30/2024   PLT 354 05/30/2024   Lab Results  Component Value Date   IRON  98 05/30/2024   TIBC 365 05/30/2024   IRONPCTSAT 27 05/30/2024   Lab Results  Component Value Date   FERRITIN 143 05/30/2024     STUDIES: No results found.   ASSESSMENT: Iron  deficiency anemia.  PLAN:    Iron  deficiency anemia:  Resolved.  Patient's hemoglobin and iron  stores continue to be within normal limits.  She does not require additional IV Venofer  today.  Patient last received treatment on April 29, 2023.  No further intervention is needed.  After discussion with the patient, is agreed upon that no further follow-up is necessary.  Please refer patient back if there are any questions or concerns. B12 deficiency: B12 levels are within normal limits today.  Patient does not require additional B12 today.  Continue monitoring and treatment per primary care.   Hypertension: Patient's blood pressure is within normal limits today.   Patient expressed understanding and was in agreement with this plan. She also understands that She can call clinic at any time with any questions, concerns, or complaints.     Evalene JINNY Reusing, MD   05/31/2024 10:27 AM     "
